# Patient Record
Sex: Female | Born: 1995 | Race: Black or African American | Hispanic: No | Marital: Married | Smoking: Never smoker
Health system: Southern US, Community
[De-identification: ages and names within clinical notes are randomized; demographics above are authoritative.]

## PROBLEM LIST (undated history)

## (undated) DIAGNOSIS — D573 Sickle-cell trait: Secondary | ICD-10-CM

## (undated) DIAGNOSIS — N390 Urinary tract infection, site not specified: Secondary | ICD-10-CM

## (undated) DIAGNOSIS — R519 Headache, unspecified: Secondary | ICD-10-CM

## (undated) DIAGNOSIS — J45909 Unspecified asthma, uncomplicated: Secondary | ICD-10-CM

## (undated) HISTORY — DX: Unspecified asthma, uncomplicated: J45.909

## (undated) HISTORY — PX: WISDOM TOOTH EXTRACTION: SHX21

---

## 2020-07-13 ENCOUNTER — Emergency Department
Admission: EM | Admit: 2020-07-13 | Discharge: 2020-07-13 | Disposition: A | Discharge status: Home / Self Care | Payer: No Typology Code available for payment source | Attending: Emergency Medicine | Admitting: Emergency Medicine

## 2020-07-13 ENCOUNTER — Other Ambulatory Visit: Payer: Self-pay

## 2020-07-13 DIAGNOSIS — T25222A Burn of second degree of left foot, initial encounter: Secondary | ICD-10-CM | POA: Insufficient documentation

## 2020-07-13 DIAGNOSIS — X100XXA Contact with hot drinks, initial encounter: Secondary | ICD-10-CM | POA: Diagnosis not present

## 2020-07-13 DIAGNOSIS — T2122XA Burn of second degree of abdominal wall, initial encounter: Secondary | ICD-10-CM | POA: Diagnosis not present

## 2020-07-13 DIAGNOSIS — Z23 Encounter for immunization: Secondary | ICD-10-CM | POA: Insufficient documentation

## 2020-07-13 DIAGNOSIS — S99922A Unspecified injury of left foot, initial encounter: Secondary | ICD-10-CM | POA: Diagnosis present

## 2020-07-13 MED ORDER — IBUPROFEN 600 MG PO TABS
600.0000 mg | ORAL_TABLET | Freq: Once | ORAL | Status: AC
  Administered 2020-07-13: 600 mg via ORAL
  Filled 2020-07-13: qty 1

## 2020-07-13 MED ORDER — IBUPROFEN 600 MG PO TABS: 600.0000 mg | ORAL_TABLET | Freq: Four times a day (QID) | ORAL | 0 refills | Status: DC | PRN

## 2020-07-13 MED ORDER — SILVER SULFADIAZINE 1 % EX CREA: TOPICAL_CREAM | CUTANEOUS | 1 refills | Status: DC

## 2020-07-13 MED ORDER — TRAMADOL HCL 50 MG PO TABS
50.0000 mg | ORAL_TABLET | Freq: Once | ORAL | Status: AC
  Administered 2020-07-13: 50 mg via ORAL
  Filled 2020-07-13: qty 1

## 2020-07-13 MED ORDER — SILVER SULFADIAZINE 1 % EX CREA: TOPICAL_CREAM | Freq: Once | CUTANEOUS | Status: AC

## 2020-07-13 MED ORDER — TETANUS-DIPHTH-ACELL PERTUSSIS 5-2.5-18.5 LF-MCG/0.5 IM SUSY
0.5000 mL | PREFILLED_SYRINGE | Freq: Once | INTRAMUSCULAR | Status: AC
  Administered 2020-07-13: 0.5 mL via INTRAMUSCULAR
  Filled 2020-07-13: qty 0.5

## 2020-07-13 MED ORDER — TRAMADOL HCL 50 MG PO TABS
50.0000 mg | ORAL_TABLET | Freq: Four times a day (QID) | ORAL | 0 refills | Status: DC | PRN
Start: 2020-07-13 — End: 2021-01-11

## 2020-07-13 NOTE — ED Triage Notes (Signed)
Pt from home, spilled hot coffee on top of left foot around 1145 am. Pt presents with large fluid filled blisters on top of left foot, 1cmx 2 cm and 2cm x 6 cm. Pt denies other burns.

## 2020-07-13 NOTE — ED Provider Notes (Signed)
[facility] Emergency Department Provider Note   ____________________________________________   Event Date/Time   First MD Initiated Contact with Patient 07/13/20 1242     (approximate)  I have reviewed the triage vital signs and the nursing notes.   HISTORY  Chief Complaint Foot Burn    HPI Kelly Hooper is a 24 y.o. female patient presents with burn to the dorsal aspect of the left foot.  Patient states she spilled hot coffee on the top of her foot.  Patient presents with blisters on the top part of her foot.  Patient denies loss sensation or loss of function.  Patient rates pain as a 6/10.  Patient described pain as "sore".  No palliative measure prior to arrival.  Patient tetanus shot is not up-to-date.         History reviewed. No pertinent past medical history.  There are no problems to display for this patient.   History reviewed. No pertinent surgical history.  Prior to Admission medications   Medication Sig Start Date End Date Taking? Authorizing Provider  ibuprofen (ADVIL) 600 MG tablet Take 1 tablet (600 mg total) by mouth every 6 (six) hours as needed. 07/13/20   Joni Reining, PA-C  silver sulfADIAZINE (SILVADENE) 1 % cream Apply to affected area twice a day dressing change. 07/13/20 07/13/21  Joni Reining, PA-C  traMADol (ULTRAM) 50 MG tablet Take 1 tablet (50 mg total) by mouth every 6 (six) hours as needed for moderate pain. 07/13/20   Joni Reining, PA-C    Allergies Patient has no allergy information on record.  History reviewed. No pertinent family history.  Social History Social History   Tobacco Use  . Smoking status: Never Smoker  . Smokeless tobacco: Never Used  Vaping Use  . Vaping Use: Never used    Review of Systems Constitutional: No fever/chills Eyes: No visual changes. ENT: No sore throat. Cardiovascular: Denies chest pain. Respiratory: Denies shortness of breath. Gastrointestinal: No abdominal pain.   No nausea, no vomiting.  No diarrhea.  No constipation. Genitourinary: Negative for dysuria. Musculoskeletal: Negative for back pain. Skin: Negative for rash.  2 bullous lesions dorsal aspect of left foot Neurological: Negative for headaches, focal weakness or numbness.   ____________________________________________   PHYSICAL EXAM:  VITAL SIGNS: ED Triage Vitals  Enc Vitals Group     BP 07/13/20 1235 124/76     Pulse Rate 07/13/20 1235 75     Resp 07/13/20 1235 16     Temp 07/13/20 1235 98 F (36.7 C)     Temp Source 07/13/20 1235 Oral     SpO2 07/13/20 1235 100 %     Weight 07/13/20 1237 165 lb (74.8 kg)     Height 07/13/20 1237 5\' 2"  (1.575 m)     Head Circumference --      Peak Flow --      Pain Score 07/13/20 1237 6     Pain Loc --      Pain Edu? --      Excl. in GC? --    Constitutional: Alert and oriented. Well appearing and in no acute distress. Cardiovascular: Normal rate, regular rhythm. Grossly normal heart sounds.  Good peripheral circulation. Respiratory: Normal respiratory effort.  No retractions. Lungs CTAB. Skin:  Skin is warm, dry and intact.  2 intact bullous lesions dorsal aspect the right foot.  Psychiatric: Mood and affect are normal. Speech and behavior are normal.  ____________________________________________   LABS (all labs ordered  are listed, but only abnormal results are displayed)  Labs Reviewed - No data to display ____________________________________________  EKG   ____________________________________________  RADIOLOGY I, Joni Reining, personally viewed and evaluated these images (plain radiographs) as part of my medical decision making, as well as reviewing the written report by the radiologist.  ED MD interpretation:    Official radiology report(s): No results found.  ____________________________________________   PROCEDURES  Procedure(s) performed (including Critical  Care):  Procedures   ____________________________________________   INITIAL IMPRESSION / ASSESSMENT AND PLAN / ED COURSE  As part of my medical decision making, I reviewed the following data within the electronic MEDICAL RECORD NUMBER         Patient presents with second-degree burns to the dorsal aspect the left foot.  Patient given discharge care instruction advised take medication as directed.  Patient advised to follow-up with the [facility] clinic in 4 days.  Return to ED if condition worsens.      ____________________________________________   FINAL CLINICAL IMPRESSION(S) / ED DIAGNOSES  Final diagnoses:  Partial thickness burn of abdomen, initial encounter     ED Discharge Orders         Ordered    silver sulfADIAZINE (SILVADENE) 1 % cream        07/13/20 1253    traMADol (ULTRAM) 50 MG tablet  Every 6 hours PRN        07/13/20 1253    ibuprofen (ADVIL) 600 MG tablet  Every 6 hours PRN        07/13/20 1253          *Please note:  Kelly Hooper was evaluated in Emergency Department on 07/13/2020 for the symptoms described in the history of present illness. She was evaluated in the context of the global COVID-19 pandemic, which necessitated consideration that the patient might be at risk for infection with the SARS-CoV-2 virus that causes COVID-19. Institutional protocols and algorithms that pertain to the evaluation of patients at risk for COVID-19 are in a state of rapid change based on information released by regulatory bodies including the CDC and federal and state organizations. These policies and algorithms were followed during the patient's care in the ED.  Some ED evaluations and interventions may be delayed as a result of limited staffing during and the pandemic.*   Note:  This document was prepared using Dragon voice recognition software and may include unintentional dictation errors.    Joni Reining, PA-C 07/13/20 1302    Dionne Bucy,  MD 07/13/20 [phone]

## 2020-07-13 NOTE — Discharge Instructions (Signed)
Follow discharge care instruction take medication as directed. °

## 2020-08-11 ENCOUNTER — Ambulatory Visit: Payer: BC Managed Care – PPO | Admitting: Nurse Practitioner

## 2020-08-11 ENCOUNTER — Encounter: Payer: Self-pay | Admitting: Nurse Practitioner

## 2020-08-11 ENCOUNTER — Other Ambulatory Visit: Payer: Self-pay

## 2020-08-11 VITALS — BP 112/62 | HR 76 | Temp 97.8°F | Ht <= 58 in | Wt 157.8 lb

## 2020-08-11 DIAGNOSIS — T25222D Burn of second degree of left foot, subsequent encounter: Secondary | ICD-10-CM | POA: Diagnosis not present

## 2020-08-11 DIAGNOSIS — Z2821 Immunization not carried out because of patient refusal: Secondary | ICD-10-CM

## 2020-08-11 DIAGNOSIS — Z6833 Body mass index (BMI) 33.0-33.9, adult: Secondary | ICD-10-CM

## 2020-08-11 DIAGNOSIS — E6609 Other obesity due to excess calories: Secondary | ICD-10-CM

## 2020-08-11 DIAGNOSIS — Z7689 Persons encountering health services in other specified circumstances: Secondary | ICD-10-CM

## 2020-08-11 DIAGNOSIS — D573 Sickle-cell trait: Secondary | ICD-10-CM | POA: Insufficient documentation

## 2020-08-11 MED ORDER — SILVER SULFADIAZINE 1 % EX CREA: TOPICAL_CREAM | CUTANEOUS | 1 refills | Status: DC

## 2020-08-11 NOTE — Progress Notes (Signed)
I,Tianna Badgett,acting as a Neurosurgeon for [employer], NP.,have documented all relevant documentation on the behalf of [employer], NP,as directed by  Charlesetta Ivory, NP while in the presence of Charlesetta Ivory, NP.  This visit occurred during the SARS-CoV-2 public health emergency.  Safety protocols were in place, including screening questions prior to the visit, additional usage of staff PPE, and extensive cleaning of exam room while observing appropriate contact time as indicated for disinfecting solutions.  Subjective:     Patient ID: Kelly Hooper , female    DOB: [birth year] , 25 y.o.   MRN: [id number]   Chief Complaint  Patient presents with  . Establish Care         HPI  Patient is here to establish care. 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 She just moved from [place] with her boyfriend. She recent had a burn at work from a coffee spill that she would liked looked at. She has just been putting cream on it that was given at the emergency room. She has a history of asthma, sickle cell trait. She has aches and pain around her menstrual period. LMP: 07/29/2021. Sexually active. No smoking. Yes to alcohol with wine occasionally.  No OBGYN but will establish care with someone. Paps smear at age 48. Patient will schedule an appt for a physical     Past Medical History:  Diagnosis Date  . Asthma      Family History  Problem Relation Age of Onset  . Sickle cell anemia Mother   . Asthma Father   . Hypertension Maternal Grandmother   . Stroke Maternal Grandmother   . Hypertension Maternal Grandfather   . Heart disease Maternal Grandfather   . Diabetes Paternal Grandfather      Current Outpatient Medications:  .  albuterol (VENTOLIN HFA) 108 (90 Base) MCG/ACT inhaler, Inhale into the lungs every 6 (six) hours as needed for wheezing or shortness of breath., Disp: , Rfl:  .  ibuprofen (ADVIL) 600 MG tablet, Take 1 tablet (600 mg total) by mouth every 6 (six) hours as  needed., Disp: 30 tablet, Rfl: 0 .  silver sulfADIAZINE (SILVADENE) 1 % cream, Apply to affected area twice a day dressing change., Disp: 50 g, Rfl: 1 .  traMADol (ULTRAM) 50 MG tablet, Take 1 tablet (50 mg total) by mouth every 6 (six) hours as needed for moderate pain., Disp: 12 tablet, Rfl: 0   No Known Allergies   Review of Systems  Constitutional: Negative.   Respiratory: Negative.  Negative for cough, shortness of breath and wheezing.   Cardiovascular: Negative.  Negative for chest pain and palpitations.  Gastrointestinal: Negative.   Skin: Positive for wound.       Burn on foot  Neurological: Negative.      Today's Vitals   08/11/20 0914  BP: 112/62  Pulse: 76  Temp: 97.8 F (36.6 C)  TempSrc: Oral  Weight: 157 lb 12.8 oz (71.6 kg)  Height: 4' 9.2" (1.453 m)   Body mass index is 33.91 kg/m.  Wt Readings from Last 3 Encounters:  08/11/20 157 lb 12.8 oz (71.6 kg)  07/13/20 165 lb (74.8 kg)    Objective:  Physical Exam Constitutional:      Appearance: Normal appearance. She is obese.  Cardiovascular:     Rate and Rhythm: Normal rate and regular rhythm.     Pulses: Normal pulses.     Heart sounds: Normal heart sounds.  Pulmonary:     Effort: Pulmonary effort  is normal.     Breath sounds: Normal breath sounds. No wheezing, rhonchi or rales.  Skin:    General: Skin is warm and dry.     Capillary Refill: Capillary refill takes less than 2 seconds.     Findings: Burn present.          Comments: Partial thickness burn to left foot- healing   Neurological:     Mental Status: She is alert.         Assessment And Plan:     1. Encounter to establish care -here to establish care.  -will schedule appointment for physical exam  -reviewed social, medical, family history with patient   2. Partial thickness burn of left foot, subsequent encounter -recently went to the ED on 07/13/20 for a burn from a coffee spill on her top left foot.  -Continue using the  silvadene 1% cream to her foot twice daily  With dressing change- refilled on today's visit.  -Patient will call us if it is not healing  -Recheck in 2 weeks   3. Influenza vaccination declined -patient declined   4. COVID-19 vaccination declined -patient declined   5. Class 1 obesity due to excess calories without serious comorbidity with body mass index (BMI) of 33.0 to 33.9 in adult   Staying healthy and adopting a healthy lifestyle for your overall health is important. You should eat 7 or more servings of fruits and vegetables per day. You should drink plenty of water to keep yourself hydrated and your kidneys healthy. This includes about 65-80+ fluid ounces of water. Limit your intake of animal fats especially for elevated cholesterol. Avoid highly processed food and limit your salt intake if you have hypertension. Avoid foods high in saturated/Trans fats. Along with a healthy diet it is also very important to maintain time for yourself to maintain a healthy mental health with low stress levels. You should get atleast 150 min of moderate intensity exercise weekly for a healthy heart. Along with eating right and exercising, aim for at least 7-9 hours of sleep daily.  Eat more whole grains which includes barley, wheat berries, oats, brown rice and whole wheat pasta. Use healthy plant oils which include olive, soy, corn, sunflower and peanut. Limit your caffeine and sugary drinks. Limit your intake of fast foods. Limit milk and dairy products to one or two daily servings.   A work not was written for this patient due to her burn on her foot for the next 2 weeks. Will recheck in 2 weeks. Her work requires her to stand on her feet a lot and walk as she works as a Firefighter.   Patient was given opportunity to ask questions. Patient verbalized understanding of the plan and was able to repeat key elements of the plan. All questions were answered to their satisfaction.  Charlesetta Ivory, NP   I,  Charlesetta Ivory, NP, have reviewed all documentation for this visit. The documentation on 08/11/20 for the exam, diagnosis, procedures, and orders are all accurate and complete.  THE PATIENT IS ENCOURAGED TO PRACTICE SOCIAL DISTANCING DUE TO THE COVID-19 PANDEMIC.

## 2020-08-11 NOTE — Patient Instructions (Signed)
Burn Care, Adult A burn is an injury to the skin or the tissues under the skin. There are three types of burns:  First degree. These burns may cause the skin to be red and a bit swollen.  Second degree. These burns are very painful and cause the skin to be very red. The skin may also leak fluid, look shiny, and start to have blisters.  Third degree. These burns cause permanent damage. They turn the skin white or black and make it look charred, dry, and leathery. Taking care of your burn properly can help to prevent pain and infection. It can also help the burn to heal more quickly. How is this treated? Right after a burn:  Rinse or soak the burn under cool water. Do this for several minutes. Do not put ice on your burn. That can cause more damage.  Lightly cover the burn with a clean (sterile) cloth (dressing). Burn care  Raise (elevate) the injured area above the level of your heart while sitting or lying down.  Follow instructions from your doctor about: ? How to clean and take care of the burn. ? When to change and remove the cloth.  Check your burn every day for signs of infection. Check for: ? More redness, swelling, or pain. ? Warmth. ? Pus or a bad smell. Medicine   Take over-the-counter and prescription medicines only as told by your doctor.  If you were prescribed antibiotic medicine, take or apply it as told by your doctor. Do not stop using the antibiotic even if your condition improves. General instructions  To prevent infection: ? Do not put butter, oil, or other home treatments on the burn. ? Do not scratch or pick at the burn. ? Do not break any blisters. ? Do not peel skin.  Do not rub your burn, even when you are cleaning it.  Protect your burn from the sun. Contact a doctor if:  Your condition does not get better.  Your condition gets worse.  You have a fever.  Your burn looks different or starts to have black or red spots on it.  Your burn  feels warm to the touch.  Your pain is not controlled with medicine. Get help right away if:  You have redness, swelling, or pain at the site of the burn.  You have fluid, blood, or pus coming from your burn.  You have red streaks near the burn.  You have very bad pain. This information is not intended to replace advice given to you by your health care provider. Make sure you discuss any questions you have with your health care provider. Document Revised: 11/11/2018 Document Reviewed: 01/09/2016 Elsevier Patient Education  2020 Elsevier Inc.  

## 2020-08-25 ENCOUNTER — Telehealth (INDEPENDENT_AMBULATORY_CARE_PROVIDER_SITE_OTHER): Payer: BC Managed Care – PPO | Admitting: Nurse Practitioner

## 2020-08-25 ENCOUNTER — Ambulatory Visit: Payer: BC Managed Care – PPO | Admitting: Nurse Practitioner

## 2020-08-25 ENCOUNTER — Encounter: Payer: Self-pay | Admitting: Nurse Practitioner

## 2020-08-25 VITALS — Temp 98.1°F

## 2020-08-25 DIAGNOSIS — X100XXD Contact with hot drinks, subsequent encounter: Secondary | ICD-10-CM | POA: Diagnosis not present

## 2020-08-25 DIAGNOSIS — T25222D Burn of second degree of left foot, subsequent encounter: Secondary | ICD-10-CM | POA: Diagnosis not present

## 2020-08-25 DIAGNOSIS — Y92214 College as the place of occurrence of the external cause: Secondary | ICD-10-CM

## 2020-08-25 NOTE — Patient Instructions (Signed)
Burn Care, Adult A burn is an injury to the skin or the tissues under the skin. There are three types of burns:  First degree. These burns may cause the skin to be red and a bit swollen.  Second degree. These burns are very painful and cause the skin to be very red. The skin may also swell, leak fluid, look shiny, and start to have blisters.  Third degree. These burns cause lasting damage. They turn the skin white or black and make it look charred, dry, and leathery. Treatment for your burn will depend on the type of burn you have. Taking good care of your burn can help to prevent pain and infection. It can also help the burn heal quickly. How to care for a first-degree burn Right after a burn:  Rinse or soak the burn under cool water for 5 minutes or more. Do not put ice on your burn. That can cause more damage.  Put a cool, clean, wet cloth on your burn.  Put lotion or gel with aloe vera on your burn. Caring for the burn Clean and care for your burn. Your doctor may tell you:  To clean the burn using soap and water.  To pat the burn dry using a clean cloth. Do not rub or scrub the burn.  To put lotion or gel with aloe vera on your burn. How to care for a second-degree burn Right after a burn:  Rinse or soak the burn under cool water. Do this for 5 to 10 minutes. Do not put ice on your burn. This can cause more damage.  Remove any jewelry near the burned area.  Cover the burn with a clean cloth. Caring for the burn  Raise (elevate) the burned area above the level of your heart while sitting or lying down.  Clean and care for your burn. Your doctor may tell you: ? To clean or rinse your burn. ? To put a cream or ointment on the burn. ? To place a germ-free (sterile) dressing over the burn. A dressing is a material that is placed on a burn to help it heal. How to care for a third-degree burn Right after a burn:  Cover the burn with a clean, dry cloth.  Seek treatment  right away if you have this kind of burn. You may: ? Need to stay in the hospital. ? Have surgery to remove burned tissue. ? Have surgery to put new skin on the burned area. ? Be given fluids through an IV tube. Caring for the burn Clean and care for your burn. Your doctor may tell you:  To clean or rinse your burn.  To put a cream or ointment on the burn.  To put a sterile dressing in the burn. This is called packing.  To place a sterile dressing over the burn. Other things to do  Raise the burned area above the level of your heart while sitting or lying down.  Wear splints or immobilizers if told by your doctor.  Rest as told by your doctor. Do not do sports or other activities until your doctor approves. How to prevent infection when caring for a burn  Take these steps to prevent infection: ? Wash your hands with soap and water for at least 20 seconds before and after caring for your burn. If you cannot use soap and water, use hand sanitizer. ? Wear clean or sterile gloves as told by your doctor. ? Do not put butter, oil,   toothpaste, or other home remedies on the burn. ? Do not scratch or pick at the burn. ? Do not break any blisters. ? Do not peel the skin. ? Do not rub your burn, even when you are cleaning it.  Check your burn every day for these signs of infection: ? More redness, swelling, or pain. ? Warmth. ? Pus or a bad smell. ? Red streaks around the burn.   Follow these instructions at home Medicines  Take over-the-counter and prescription medicines only as told by your doctor.  If you were prescribed an antibiotic medicine, use it as told by your doctor. Do not stop using the antibiotic even if your condition gets better.  Your doctor may ask you to take medicine for pain before you change your dressing. General instructions  Protect your burn from the sun.  Drink enough fluid to keep your pee (urine) pale yellow.  Do not use any products that contain  nicotine or tobacco, such as cigarettes, e-cigarettes, and chewing tobacco. These can delay healing. If you need help quitting, ask your doctor.  Keep all follow-up visits as told by your doctor. This is important.   Contact a doctor if:  Your condition does not get better.  Your condition gets worse.  You have a fever or chills.  Your burn feels warm to the touch.  You have more redness, swelling, or pain on your burn.  Your burn looks different or starts to have black or red spots on it.  Your pain does not get better with medicine. Get help right away if:  You have more fluid, blood, or pus coming from your burn.  You have red streaks near the burn.  You have very bad pain. Summary  There are three types of burns. They are first degree, second degree, and third degree. Of these, a third-degree burn is most serious. This must be treated right away.  Treatment for your burn will depend on the type of burn you have.  Do not put butter, oil, toothpaste, or other home remedies on the burn. These things can damage your skin.  Follow instructions from your doctor about how to clean and take care of your burn. This information is not intended to replace advice given to you by your health care provider. Make sure you discuss any questions you have with your health care provider. Document Revised: 09/10/2019 Document Reviewed: 05/11/2019 Elsevier Patient Education  2021 [employer] Inc.  

## 2020-08-25 NOTE — Progress Notes (Signed)
Virtual Visit via Video call    This visit type was conducted due to national recommendations for restrictions regarding the COVID-19 Pandemic (e.g. social distancing) in an effort to limit this patient's exposure and mitigate transmission in our community.  Due to her co-morbid illnesses, this patient is at least at moderate risk for complications without adequate follow up.  This format is felt to be most appropriate for this patient at this time.  All issues noted in this document were discussed and addressed.  A limited physical exam was performed with this format.    This visit type was conducted due to national recommendations for restrictions regarding the COVID-19 Pandemic (e.g. social distancing) in an effort to limit this patient's exposure and mitigate transmission in our community.  Patients identity confirmed using two different identifiers.  This format is felt to be most appropriate for this patient at this time.  All issues noted in this document were discussed and addressed.  No physical exam was performed (except for noted visual exam findings with Video Visits).    Date:  08/25/2020   ID:  Kelly Hooper, DOB 1995-12-26, MRN [id number]  Patient Location: Home   Provider location:  Home     Chief Complaint:  Recheck on her left foot for her 2nd degree burn   History of Present Illness:    Kelly Hooper is a 25 y.o. female who presents via video conferencing for a telehealth visit today.    The patient does not have symptoms concerning for COVID-19 infection   Patient is here today on video virtual call for a check of her foot for which she received a 2nd degree burn from. She is doing well. She did see a specialist yesterday and she is to return to work tom. They prescribed her mobic to take once daily for pain however she has not picked it up yet. She is still using the silvadene dressing a twice daily as needed. She is taking ibuprofen for comfort. She will be going back to  work. Mainly siting down. She has no other concerns today.    Past Medical History:  Diagnosis Date  . Asthma    Past Surgical History:  Procedure Laterality Date  . WISDOM TOOTH EXTRACTION       No outpatient medications have been marked as taking for the 08/25/20 encounter (Video Visit) with Charlesetta Ivory, NP.     Allergies:   Patient has no known allergies.   Social History   Tobacco Use  . Smoking status: Never Smoker  . Smokeless tobacco: Never Used  Vaping Use  . Vaping Use: Never used  Substance Use Topics  . Alcohol use: Yes    Alcohol/week: 1.0 standard drink    Types: 1 Glasses of wine per week  . Drug use: Never     Family Hx: The patient's family history includes Asthma in her father; Diabetes in her paternal grandfather; Heart disease in her maternal grandfather; Hypertension in her maternal grandfather and maternal grandmother; Sickle cell anemia in her mother; Stroke in her maternal grandmother.  ROS:   Please see the history of present illness.    Review of Systems  Respiratory: Negative for cough and shortness of breath.   Skin:       2nd degree burn to her foot.     All other systems reviewed and are negative.   Labs/Other Tests and Data Reviewed:    Recent Labs: No results found for requested labs within last 8760  hours.   Recent Lipid Panel No results found for: CHOL, TRIG, HDL, CHOLHDL, LDLCALC, LDLDIRECT  Wt Readings from Last 3 Encounters:  08/11/20 157 lb 12.8 oz (71.6 kg)  07/13/20 165 lb (74.8 kg)     Exam:    Vital Signs:  Temp 98.1 F (36.7 C) (Oral)   LMP 07/29/2020     Physical Exam Vitals and nursing note reviewed.  HENT:     Head: Normocephalic and atraumatic.  Pulmonary:     Effort: Pulmonary effort is normal.  Musculoskeletal:       Feet:  Neurological:     Mental Status: She is alert and oriented to person, place, and time.  Psychiatric:        Mood and Affect: Affect normal.     ASSESSMENT & PLAN:     1) Partial thickness to the left foot, subsequent encounter  -Patient will continue to use silvadene dressing twice daily as needed  -She may use OTC ibuprofen as needed for comfort or pain -Limited activity  -Wear comfortable footwear -May return but only for sitting down duties.   Follow up:  As needed. Call or return if any problems or concerns.   Patient Risk:   After full review of this patients clinical status, I feel that they are at least moderate risk at this time.  Time:   Today, I have spent 10 minutes with the patient with telehealth technology discussing above diagnoses.     Medication Adjustments/Labs and Tests Ordered: Current medicines are reviewed at length with the patient today.  Concerns regarding medicines are outlined above.   Tests Ordered: No orders of the defined types were placed in this encounter.   Medication Changes: No orders of the defined types were placed in this encounter.   Disposition:  Follow up as needed   Signed, Charlesetta Ivory, NP

## 2020-11-23 ENCOUNTER — Ambulatory Visit (INDEPENDENT_AMBULATORY_CARE_PROVIDER_SITE_OTHER): Payer: BC Managed Care – PPO | Admitting: Nurse Practitioner

## 2020-11-23 ENCOUNTER — Other Ambulatory Visit: Payer: Self-pay

## 2020-11-23 VITALS — BP 126/70 | HR 79 | Temp 98.1°F | Ht 61.8 in | Wt 160.8 lb

## 2020-11-23 DIAGNOSIS — T25222D Burn of second degree of left foot, subsequent encounter: Secondary | ICD-10-CM | POA: Diagnosis not present

## 2020-11-23 DIAGNOSIS — Z01419 Encounter for gynecological examination (general) (routine) without abnormal findings: Secondary | ICD-10-CM

## 2020-11-23 DIAGNOSIS — Z0000 Encounter for general adult medical examination without abnormal findings: Secondary | ICD-10-CM

## 2020-11-23 NOTE — Patient Instructions (Signed)
Health Maintenance, Female Adopting a healthy lifestyle and getting preventive care are important in promoting health and wellness. Ask your health care provider about:  The right schedule for you to have regular tests and exams.  Things you can do on your own to prevent diseases and keep yourself healthy. What should I know about diet, weight, and exercise? Eat a healthy diet  Eat a diet that includes plenty of vegetables, fruits, low-fat dairy products, and lean protein.  Do not eat a lot of foods that are high in solid fats, added sugars, or sodium.   Maintain a healthy weight Body mass index (BMI) is used to identify weight problems. It estimates body fat based on height and weight. Your health care provider can help determine your BMI and help you achieve or maintain a healthy weight. Get regular exercise Get regular exercise. This is one of the most important things you can do for your health. Most adults should:  Exercise for at least 150 minutes each week. The exercise should increase your heart rate and make you sweat (moderate-intensity exercise).  Do strengthening exercises at least twice a week. This is in addition to the moderate-intensity exercise.  Spend less time sitting. Even light physical activity can be beneficial. Watch cholesterol and blood lipids Have your blood tested for lipids and cholesterol at 25 years of age, then have this test every 5 years. Have your cholesterol levels checked more often if:  Your lipid or cholesterol levels are high.  You are older than 25 years of age.  You are at high risk for heart disease. What should I know about cancer screening? Depending on your health history and family history, you may need to have cancer screening at various ages. This may include screening for:  Breast cancer.  Cervical cancer.  Colorectal cancer.  Skin cancer.  Lung cancer. What should I know about heart disease, diabetes, and high blood  pressure? Blood pressure and heart disease  High blood pressure causes heart disease and increases the risk of stroke. This is more likely to develop in people who have high blood pressure readings, are of African descent, or are overweight.  Have your blood pressure checked: ? Every 3-5 years if you are 18-39 years of age. ? Every year if you are 40 years old or older. Diabetes Have regular diabetes screenings. This checks your fasting blood sugar level. Have the screening done:  Once every three years after age 40 if you are at a normal weight and have a low risk for diabetes.  More often and at a younger age if you are overweight or have a high risk for diabetes. What should I know about preventing infection? Hepatitis B If you have a higher risk for hepatitis B, you should be screened for this virus. Talk with your health care provider to find out if you are at risk for hepatitis B infection. Hepatitis C Testing is recommended for:  Everyone born from 1945 through 1965.  Anyone with known risk factors for hepatitis C. Sexually transmitted infections (STIs)  Get screened for STIs, including gonorrhea and chlamydia, if: ? You are sexually active and are younger than 24 years of age. ? You are older than 24 years of age and your health care provider tells you that you are at risk for this type of infection. ? Your sexual activity has changed since you were last screened, and you are at increased risk for chlamydia or gonorrhea. Ask your health care provider   if you are at risk.  Ask your health care provider about whether you are at high risk for HIV. Your health care provider may recommend a prescription medicine to help prevent HIV infection. If you choose to take medicine to prevent HIV, you should first get tested for HIV. You should then be tested every 3 months for as long as you are taking the medicine. Pregnancy  If you are about to stop having your period (premenopausal) and  you may become pregnant, seek counseling before you get pregnant.  Take 400 to 800 micrograms (mcg) of folic acid every day if you become pregnant.  Ask for birth control (contraception) if you want to prevent pregnancy. Osteoporosis and menopause Osteoporosis is a disease in which the bones lose minerals and strength with aging. This can result in bone fractures. If you are 65 years old or older, or if you are at risk for osteoporosis and fractures, ask your health care provider if you should:  Be screened for bone loss.  Take a calcium or vitamin D supplement to lower your risk of fractures.  Be given hormone replacement therapy (HRT) to treat symptoms of menopause. Follow these instructions at home: Lifestyle  Do not use any products that contain nicotine or tobacco, such as cigarettes, e-cigarettes, and chewing tobacco. If you need help quitting, ask your health care provider.  Do not use street drugs.  Do not share needles.  Ask your health care provider for help if you need support or information about quitting drugs. Alcohol use  Do not drink alcohol if: ? Your health care provider tells you not to drink. ? You are pregnant, may be pregnant, or are planning to become pregnant.  If you drink alcohol: ? Limit how much you use to 0-1 drink a day. ? Limit intake if you are breastfeeding.  Be aware of how much alcohol is in your drink. In the [place] one drink equals one 12 oz bottle of beer (355 mL), one 5 oz glass of wine (148 mL), or one 1 oz glass of hard liquor (44 mL). General instructions  Schedule regular health, dental, and eye exams.  Stay current with your vaccines.  Tell your health care provider if: ? You often feel depressed. ? You have ever been abused or do not feel safe at home. Summary  Adopting a healthy lifestyle and getting preventive care are important in promoting health and wellness.  Follow your health care provider's instructions about healthy  diet, exercising, and getting tested or screened for diseases.  Follow your health care provider's instructions on monitoring your cholesterol and blood pressure. This information is not intended to replace advice given to you by your health care provider. Make sure you discuss any questions you have with your health care provider. Document Revised: 07/15/2018 Document Reviewed: 07/15/2018 Elsevier Patient Education  2021 [employer] Inc[employer]  

## 2020-11-23 NOTE — Progress Notes (Signed)
I,Tianna Badgett,acting as a Education administrator for [employer], NP.,have documented all relevant documentation on the behalf of [employer], NP,as directed by  Bary Castilla, NP while in the presence of Bary Castilla, NP.  This visit occurred during the SARS-CoV-2 public health emergency.  Safety protocols were in place, including screening questions prior to the visit, additional usage of staff PPE, and extensive cleaning of exam room while observing appropriate contact time as indicated for disinfecting solutions.  Subjective:     Patient ID: Kelly Hooper , female    DOB: September 29, 1995 , 25 y.o.   MRN: [id number]   Chief Complaint  Patient presents with  . Annual Exam    HPI  Patient is here for physical exam. She has no concerns at this time. She would like LMP: 2 weeks; mild cramping  Sexual active: condom  Drink: occasional  Smoke: occasional vap every  Diet: she eats a lot of pasta. She eats a some fruit and vegetables  Exercise: None  She had COVID in 2020 March  Wt Readings from Last 3 Encounters: 11/23/20 : 160 lb 12.8 oz (72.9 kg) 08/11/20 : 157 lb 12.8 oz (71.6 kg) 07/13/20 : 165 lb (74.8 kg)     Past Medical History:  Diagnosis Date  . Asthma      Family History  Problem Relation Age of Onset  . Sickle cell anemia Mother   . Asthma Father   . Hypertension Maternal Grandmother   . Stroke Maternal Grandmother   . Hypertension Maternal Grandfather   . Heart disease Maternal Grandfather   . Diabetes Paternal Grandfather      Current Outpatient Medications:  .  albuterol (VENTOLIN HFA) 108 (90 Base) MCG/ACT inhaler, Inhale into the lungs every 6 (six) hours as needed for wheezing or shortness of breath., Disp: , Rfl:  .  ibuprofen (ADVIL) 600 MG tablet, Take 1 tablet (600 mg total) by mouth every 6 (six) hours as needed., Disp: 30 tablet, Rfl: 0 .  silver sulfADIAZINE (SILVADENE) 1 % cream, Apply to affected area twice a day dressing change., Disp: 50  g, Rfl: 1 .  traMADol (ULTRAM) 50 MG tablet, Take 1 tablet (50 mg total) by mouth every 6 (six) hours as needed for moderate pain., Disp: 12 tablet, Rfl: 0   No Known Allergies    The patient states she uses condoms for birth control. Last LMP was Patient's last menstrual period was 11/03/2020 (approximate)..Negative for: breast discharge, breast lump(s), breast pain and breast self exam. Associated symptoms include abnormal vaginal bleeding. Pertinent negatives include abnormal bleeding (hematology), anxiety, decreased libido, depression, difficulty falling sleep, dyspareunia, history of infertility, nocturia, sexual dysfunction, sleep disturbances, urinary incontinence, urinary urgency, vaginal discharge and vaginal itching. Diet regular.The patient states her exercise level is    . The patient's tobacco use is:  Social History   Tobacco Use  Smoking Status Never Smoker  Smokeless Tobacco Never Used  . She has been exposed to passive smoke. The patient's alcohol use is:  Social History   Substance and Sexual Activity  Alcohol Use Yes  . Alcohol/week: 1.0 standard drink  . Types: 1 Glasses of wine per week  . Additional information: Last pap pt. Does not recall , next one scheduled for this year with a OBGYN   Review of Systems  Constitutional: Negative.  Negative for chills and fatigue.  HENT: Negative.  Negative for congestion, hearing loss and rhinorrhea.   Eyes: Negative.   Respiratory: Negative.  Negative for apnea, shortness  of breath and wheezing.   Cardiovascular: Negative.  Negative for chest pain and palpitations.  Gastrointestinal: Negative.  Negative for diarrhea, nausea and vomiting.  Endocrine: Negative.   Genitourinary: Negative.   Musculoskeletal: Negative.  Negative for arthralgias and myalgias.  Skin: Negative.   Allergic/Immunologic: Negative.   Neurological: Negative.  Negative for dizziness, tremors and numbness.  Hematological: Negative.    Psychiatric/Behavioral: Negative.  Negative for sleep disturbance.     Today's Vitals   11/23/20 0857  BP: 126/70  Pulse: 79  Temp: 98.1 F (36.7 C)  TempSrc: Oral  Weight: 160 lb 12.8 oz (72.9 kg)  Height: 5' 1.8" (1.57 m)   Body mass index is 29.6 kg/m.   Objective:  Physical Exam Vitals and nursing note reviewed.  Constitutional:      Appearance: Normal appearance.  HENT:     Head: Normocephalic and atraumatic.     Right Ear: Tympanic membrane, ear canal and external ear normal. There is no impacted cerumen.     Left Ear: Tympanic membrane, ear canal and external ear normal. There is no impacted cerumen.     Nose: Nose normal.     Mouth/Throat:     Mouth: Mucous membranes are moist.     Pharynx: Oropharynx is clear.  Eyes:     Extraocular Movements: Extraocular movements intact.     Conjunctiva/sclera: Conjunctivae normal.     Pupils: Pupils are equal, round, and reactive to light.  Cardiovascular:     Rate and Rhythm: Normal rate and regular rhythm.     Pulses: Normal pulses.     Heart sounds: Normal heart sounds.  Pulmonary:     Effort: Pulmonary effort is normal. No respiratory distress.     Breath sounds: Normal breath sounds. No wheezing.  Chest:  Breasts:     Tanner Score is 5.     Right: Normal.     Left: Normal.    Abdominal:     General: Abdomen is flat. Bowel sounds are normal.     Palpations: Abdomen is soft.  Genitourinary:    Comments: Refused. Patient would like to see a OBGYN  Musculoskeletal:        General: No swelling. Normal range of motion.     Cervical back: Normal range of motion and neck supple.  Skin:    General: Skin is warm and dry.     Capillary Refill: Capillary refill takes less than 2 seconds.  Neurological:     General: No focal deficit present.     Mental Status: She is alert and oriented to person, place, and time. Mental status is at baseline.  Psychiatric:        Mood and Affect: Mood normal.        Behavior:  Behavior normal.        Thought Content: Thought content normal.         Assessment And Plan:     1. Encounter for annual physical exam --Patient is here for their annual physical exam and we discussed any changes to medication and medical history.  -Behavior modification was discussed as well as diet and exercise history  -Patient will continue to exercise regularly and modify their diet.  -Recommendation for yearly physical annuals, immunization and screenings including mammogram and colonoscopy were discussed with the patient.  -Recommended intake of multivitamin, vitamin D and calcium.  -Individualized advise was given to the patient pertaining to their own health history in regards to diet, exercise, medical condition and referrals.  -  CBC - Hemoglobin A1c - CMP14+EGFR - Lipid panel - Hepatitis C antibody - HIV Antibody (routine testing w rflx)  2. Partial thickness burn of left foot, subsequent encounter -Follows up with Sarajane Jews MD  -Doing well; taking mobic as needed. No restrictions. Regular footwear. Activity as tolerated  3. Encounter for gynecological examination -Patient would like to establish care with a OBGYN  -Will send referral  - Ambulatory referral to Obstetrics / Gynecology  Staying healthy and adopting a healthy lifestyle for your overall health is important. You should eat 7 or more servings of fruits and vegetables per day. You should drink plenty of water to keep yourself hydrated and your kidneys healthy. This includes about 65-80+ fluid ounces of water. Limit your intake of animal fats especially for elevated cholesterol. Avoid highly processed food and limit your salt intake if you have hypertension. Avoid foods high in saturated/Trans fats. Along with a healthy diet it is also very important to maintain time for yourself to maintain a healthy mental health with low stress levels. You should get atleast 150 min of moderate intensity exercise weekly for a  healthy heart. Along with eating right and exercising, aim for at least 7-9 hours of sleep daily.  Eat more whole grains which includes barley, wheat berries, oats, brown rice and whole wheat pasta. Use healthy plant oils which include olive, soy, corn, sunflower and peanut. Limit your caffeine and sugary drinks. Limit your intake of fast foods. Limit milk and dairy products to one or two daily servings.     Patient was given opportunity to ask questions. Patient verbalized understanding of the plan and was able to repeat key elements of the plan. All questions were answered to their satisfaction.   Bary Castilla, DNP   I, Bary Castilla, DNP have reviewed all documentation for this visit. The documentation on 11/23/20 for the exam, diagnosis, procedures, and orders are all accurate and complete.     THE PATIENT IS ENCOURAGED TO PRACTICE SOCIAL DISTANCING DUE TO THE COVID-19 PANDEMIC.

## 2020-11-24 LAB — HEMOGLOBIN A1C
Est. average glucose Bld gHb Est-mCnc: 103 mg/dL
Hgb A1c MFr Bld: 5.2 % (ref 4.8–5.6)

## 2020-11-24 LAB — CMP14+EGFR
ALT: 15 IU/L (ref 0–32)
AST: 12 IU/L (ref 0–40)
Albumin/Globulin Ratio: 1.9 (ref 1.2–2.2)
Albumin: 4.8 g/dL (ref 3.9–5.0)
Alkaline Phosphatase: 66 IU/L (ref 44–121)
BUN/Creatinine Ratio: 13 (ref 9–23)
BUN: 9 mg/dL (ref 6–20)
Bilirubin Total: 0.3 mg/dL (ref 0.0–1.2)
CO2: 22 mmol/L (ref 20–29)
Calcium: 10 mg/dL (ref 8.7–10.2)
Chloride: 100 mmol/L (ref 96–106)
Creatinine, Ser: 0.71 mg/dL (ref 0.57–1.00)
Globulin, Total: 2.5 g/dL (ref 1.5–4.5)
Glucose: 89 mg/dL (ref 65–99)
Potassium: 4.6 mmol/L (ref 3.5–5.2)
Sodium: 138 mmol/L (ref 134–144)
Total Protein: 7.3 g/dL (ref 6.0–8.5)
eGFR: 121 mL/min/{1.73_m2} (ref >59)

## 2020-11-24 LAB — CBC
Hematocrit: 35.5 % (ref 34.0–46.6)
Hemoglobin: 12.1 g/dL (ref 11.1–15.9)
MCH: 29.2 pg (ref 26.6–33.0)
MCHC: 34.1 g/dL (ref 31.5–35.7)
MCV: 86 fL (ref 79–97)
Platelets: 342 10*3/uL (ref 150–450)
RBC: 4.14 x10E6/uL (ref 3.77–5.28)
RDW: 13.6 % (ref 11.7–15.4)
WBC: 10 10*3/uL (ref 3.4–10.8)

## 2020-11-24 LAB — HIV ANTIBODY (ROUTINE TESTING W REFLEX): HIV Screen 4th Generation wRfx: NONREACTIVE

## 2020-11-24 LAB — LIPID PANEL
Chol/HDL Ratio: 3.5 ratio (ref 0.0–4.4)
Cholesterol, Total: 121 mg/dL (ref 100–199)
HDL: 35 mg/dL — ABNORMAL LOW (ref >39)
LDL Chol Calc (NIH): 75 mg/dL (ref 0–99)
Triglycerides: 44 mg/dL (ref 0–149)
VLDL Cholesterol Cal: 11 mg/dL (ref 5–40)

## 2020-11-24 LAB — HEPATITIS C ANTIBODY: Hep C Virus Ab: <0.1 s/co ratio (ref 0.0–0.9)

## 2021-01-11 ENCOUNTER — Other Ambulatory Visit (HOSPITAL_COMMUNITY)
Admission: RE | Admit: 2021-01-11 | Discharge: 2021-01-11 | Disposition: A | Discharge status: Home / Self Care | Payer: BC Managed Care – PPO | Source: Ambulatory Visit | Attending: Obstetrics and Gynecology | Admitting: Obstetrics and Gynecology

## 2021-01-11 ENCOUNTER — Encounter: Payer: Self-pay | Admitting: Obstetrics and Gynecology

## 2021-01-11 ENCOUNTER — Ambulatory Visit (INDEPENDENT_AMBULATORY_CARE_PROVIDER_SITE_OTHER): Payer: BC Managed Care – PPO | Admitting: Obstetrics and Gynecology

## 2021-01-11 ENCOUNTER — Other Ambulatory Visit: Payer: Self-pay

## 2021-01-11 VITALS — BP 124/77 | HR 74 | Wt 161.0 lb

## 2021-01-11 DIAGNOSIS — Z803 Family history of malignant neoplasm of breast: Secondary | ICD-10-CM

## 2021-01-11 DIAGNOSIS — Z01419 Encounter for gynecological examination (general) (routine) without abnormal findings: Secondary | ICD-10-CM

## 2021-01-11 NOTE — Progress Notes (Signed)
Obstetrics and Gynecology Annual Patient Evaluation  Appointment Date: 01/11/2021  OBGYN Clinic: [facility]  Primary Care Provider: [facility], Charlesetta Ivory, NP  Chief Complaint:  Chief Complaint  Patient presents with   Gynecologic Exam    History of Present Illness: Kelly Hooper is a 25 y.o. African-American G0 seen for the above chief complaint. Her past medical history is significant for FHx of breast cancer   Review of Systems: Pertinent items noted in HPI and remainder of comprehensive ROS otherwise negative.    Patient Active Problem List   Diagnosis Date Noted   Family history of breast cancer 01/11/2021   Sickle cell trait (HCC) 08/11/2020    Past Medical History:  Past Medical History:  Diagnosis Date   Asthma     Past Surgical History:  Past Surgical History:  Procedure Laterality Date   WISDOM TOOTH EXTRACTION      Past Obstetrical History:  OB History  Gravida Para Term Preterm AB Living  0 0 0 0 0 0  SAB IAB Ectopic Multiple Live Births  0 0 0 0 0    Past Gynecological History: As per HPI. Periods: qmonth, regular, 4-7d, somewhat heavy, not painful History of Pap Smear(s): No. She is currently using no method for contraception.   Social History:  Social History   Socioeconomic History   Marital status: Significant Other    Spouse name: Not on file   Number of children: Not on file   Years of education: Not on file   Highest education level: Not on file  Occupational History   Not on file  Tobacco Use   Smoking status: Never   Smokeless tobacco: Never  Vaping Use   Vaping Use: Never used  Substance and Sexual Activity   Alcohol use: Yes    Alcohol/week: 1.0 standard drink    Types: 1 Glasses of wine per week   Drug use: Never   Sexual activity: Yes    Birth control/protection: Condom  Other Topics Concern   Not on file  Social History Narrative   Not on file    Social Determinants of Health   Financial Resource Strain: Not on file  Food Insecurity: Food Insecurity Present   Worried About Running Out of Food in the Last Year: Sometimes true   Ran Out of Food in the Last Year: Sometimes true  Transportation Needs: No Transportation Needs   Lack of Transportation (Medical): No   Lack of Transportation (Non-Medical): No  Physical Activity: Not on file  Stress: Not on file  Social Connections: Not on file  Intimate Partner Violence: Not on file    Family History:  Family History  Problem Relation Age of Onset   Sickle cell anemia Mother    Asthma Father    Hypertension Maternal Grandmother    Stroke Maternal Grandmother    Hypertension Maternal Grandfather    Heart disease Maternal Grandfather    Diabetes Paternal Grandfather    Breast cancer Paternal Aunt    Breast cancer Paternal Uncle      Medications Noha Milberger had no medications administered during this visit. Current Outpatient Medications  Medication Sig Dispense Refill   albuterol (VENTOLIN HFA) 108 (90 Base) MCG/ACT inhaler Inhale into the lungs every 6 (six) hours as needed for wheezing or shortness of breath.     ibuprofen (ADVIL) 600 MG tablet Take 1 tablet (600 mg total) by mouth every 6 (six) hours as needed. 30 tablet 0  No current facility-administered medications for this visit.    Allergies Apple, Iodine, and Shellfish allergy   Physical Exam:  BP 124/77   Pulse 74   Wt 161 lb (73 kg)   BMI 29.64 kg/m  Body mass index is 29.64 kg/m. General appearance: Well nourished, well developed female in no acute distress.  Cardiovascular: normal s1 and s2.  No murmurs, rubs or gallops. Respiratory:  Clear to auscultation bilateral. Normal respiratory effort Abdomen: positive bowel sounds and no masses, hernias; diffusely non tender to palpation, non distended Breasts: breasts appear normal, no suspicious masses, no skin or nipple changes or axillary nodes, and  normal palpation. Neuro/Psych:  Normal mood and affect.  Skin:  Warm and dry.  Lymphatic:  No inguinal lymphadenopathy.   Pelvic exam: is not limited by body habitus EGBUS: within normal limits Vagina: within normal limits and with no blood or discharge in the vault Cervix: normal appearing cervix without tenderness, discharge or lesions. Uterus:  nonenlarged and non tender Adnexa:  normal adnexa and no mass, fullness, tenderness Rectovaginal: deferred  Laboratory: none  Radiology: none  Assessment: pt doing well  Plan:  1. Well woman exam Routine care. Pt declines STD testing, contraception. Pt using NSAIDs with periods. Recommend trying to start them a day before flow is to begin - Cytology - PAP( )  2. Family history of breast cancer Paternal aunt in her 11s and uncle too. Pt unsure if had genetic testing, but I told her that they at least would've been offered it since the dx was in the past few years. I offered her genetic testing if she's ever interested in the future. Recommend qmonth self breast exams. May need to start mammos in her 30s.    RTC 1 year and PRN  Cornelia Copa MD Attending Center for [facility])

## 2021-01-11 NOTE — Patient Instructions (Signed)
Start folic acid daily (400-800mg  at least) if you decide you want to try and conceive

## 2021-01-15 LAB — CYTOLOGY - PAP
Adequacy: ABSENT
Diagnosis: NEGATIVE

## 2021-08-08 ENCOUNTER — Inpatient Hospital Stay (HOSPITAL_COMMUNITY)
Admission: AD | Admit: 2021-08-08 | Discharge: 2021-08-08 | Disposition: A | Discharge status: Home / Self Care | Payer: BC Managed Care – PPO | Attending: Obstetrics and Gynecology | Admitting: Obstetrics and Gynecology

## 2021-08-08 ENCOUNTER — Telehealth: Payer: Self-pay | Admitting: General Practice

## 2021-08-08 ENCOUNTER — Inpatient Hospital Stay (HOSPITAL_COMMUNITY): Payer: BC Managed Care – PPO

## 2021-08-08 ENCOUNTER — Encounter (HOSPITAL_COMMUNITY): Payer: Self-pay | Admitting: Obstetrics and Gynecology

## 2021-08-08 DIAGNOSIS — Z3A01 Less than 8 weeks gestation of pregnancy: Secondary | ICD-10-CM | POA: Diagnosis not present

## 2021-08-08 DIAGNOSIS — O26891 Other specified pregnancy related conditions, first trimester: Secondary | ICD-10-CM | POA: Insufficient documentation

## 2021-08-08 DIAGNOSIS — Z3490 Encounter for supervision of normal pregnancy, unspecified, unspecified trimester: Secondary | ICD-10-CM

## 2021-08-08 DIAGNOSIS — O3680X0 Pregnancy with inconclusive fetal viability, not applicable or unspecified: Secondary | ICD-10-CM

## 2021-08-08 DIAGNOSIS — O26899 Other specified pregnancy related conditions, unspecified trimester: Secondary | ICD-10-CM | POA: Diagnosis not present

## 2021-08-08 DIAGNOSIS — R109 Unspecified abdominal pain: Secondary | ICD-10-CM

## 2021-08-08 DIAGNOSIS — R102 Pelvic and perineal pain: Secondary | ICD-10-CM | POA: Diagnosis not present

## 2021-08-08 HISTORY — DX: Sickle-cell trait: D57.3

## 2021-08-08 HISTORY — DX: Headache, unspecified: R51.9

## 2021-08-08 HISTORY — DX: Urinary tract infection, site not specified: N39.0

## 2021-08-08 LAB — URINALYSIS, ROUTINE W REFLEX MICROSCOPIC
Bilirubin Urine: NEGATIVE
Glucose, UA: NEGATIVE mg/dL
Ketones, ur: NEGATIVE mg/dL
Leukocytes,Ua: NEGATIVE
Nitrite: NEGATIVE
Protein, ur: NEGATIVE mg/dL
Specific Gravity, Urine: 1.01 (ref 1.005–1.030)
pH: 7 (ref 5.0–8.0)

## 2021-08-08 LAB — TYPE AND SCREEN
ABO/RH(D): A POS
Antibody Screen: NEGATIVE

## 2021-08-08 LAB — URINALYSIS, MICROSCOPIC (REFLEX)

## 2021-08-08 LAB — CBC WITH DIFFERENTIAL/PLATELET
Abs Immature Granulocytes: 0.03 10*3/uL (ref 0.00–0.07)
Basophils Absolute: 0 10*3/uL (ref 0.0–0.1)
Basophils Relative: 0 %
Eosinophils Absolute: 0.1 10*3/uL (ref 0.0–0.5)
Eosinophils Relative: 1 %
HCT: 31.1 % — ABNORMAL LOW (ref 36.0–46.0)
Hemoglobin: 10.8 g/dL — ABNORMAL LOW (ref 12.0–15.0)
Immature Granulocytes: 0 %
Lymphocytes Relative: 21 %
Lymphs Abs: 1.8 10*3/uL (ref 0.7–4.0)
MCH: 29.3 pg (ref 26.0–34.0)
MCHC: 34.7 g/dL (ref 30.0–36.0)
MCV: 84.3 fL (ref 80.0–100.0)
Monocytes Absolute: 0.8 10*3/uL (ref 0.1–1.0)
Monocytes Relative: 9 %
Neutro Abs: 5.8 10*3/uL (ref 1.7–7.7)
Neutrophils Relative %: 69 %
Platelets: 303 10*3/uL (ref 150–400)
RBC: 3.69 MIL/uL — ABNORMAL LOW (ref 3.87–5.11)
RDW: 13.6 % (ref 11.5–15.5)
WBC: 8.5 10*3/uL (ref 4.0–10.5)
nRBC: 0 % (ref 0.0–0.2)

## 2021-08-08 LAB — COMPREHENSIVE METABOLIC PANEL
ALT: 19 U/L (ref 0–44)
AST: 22 U/L (ref 15–41)
Albumin: 3.8 g/dL (ref 3.5–5.0)
Alkaline Phosphatase: 45 U/L (ref 38–126)
Anion gap: 6 (ref 5–15)
BUN: 5 mg/dL — ABNORMAL LOW (ref 6–20)
CO2: 24 mmol/L (ref 22–32)
Calcium: 8.9 mg/dL (ref 8.9–10.3)
Chloride: 106 mmol/L (ref 98–111)
Creatinine, Ser: 0.65 mg/dL (ref 0.44–1.00)
GFR, Estimated: >60 mL/min (ref >60)
Glucose, Bld: 107 mg/dL — ABNORMAL HIGH (ref 70–99)
Potassium: 3.9 mmol/L (ref 3.5–5.1)
Sodium: 136 mmol/L (ref 135–145)
Total Bilirubin: 0.6 mg/dL (ref 0.3–1.2)
Total Protein: 6.6 g/dL (ref 6.5–8.1)

## 2021-08-08 LAB — WET PREP, GENITAL
Clue Cells Wet Prep HPF POC: NONE SEEN
Sperm: NONE SEEN
Trich, Wet Prep: NONE SEEN
WBC, Wet Prep HPF POC: >=10 — AB (ref <10)
Yeast Wet Prep HPF POC: NONE SEEN

## 2021-08-08 LAB — POCT PREGNANCY, URINE: Preg Test, Ur: POSITIVE — AB

## 2021-08-08 LAB — HIV ANTIBODY (ROUTINE TESTING W REFLEX): HIV Screen 4th Generation wRfx: NONREACTIVE

## 2021-08-08 LAB — HCG, QUANTITATIVE, PREGNANCY: hCG, Beta Chain, Quant, S: 2257 m[IU]/mL — ABNORMAL HIGH (ref <5)

## 2021-08-08 NOTE — MAU Note (Signed)
4 days ago had bad stomach pains and then started having brown d/c. No longer having pain, d/c has continued. +HPT on 12/26, has not been confirmed.

## 2021-08-08 NOTE — MAU Note (Cosign Needed Addendum)
History    Chief Complaint  Patient presents with   Vaginal Bleeding   Possible Pregnancy   HPI: Kelly Hooper is a G1P0000 @ [redacted]w[redacted]d  here in MAU with complaints of brown vaginal discharge x 4 days, along with two days of abdominal cramping. She reports the abdominal cramping is diffuse throughout her entire abdomen, but intermittent, and resolved on its own this morning. She has not taken any medication for the pain.  She reports a positive home pregnancy test on 07/30/21, and has scheduled an initial OB appointment at [facility] for later this month. Her LMP was at the end of November which is certain.    Past Medical History:  Diagnosis Date   Asthma     Past Surgical History:  Procedure Laterality Date   WISDOM TOOTH EXTRACTION      Family History  Problem Relation Age of Onset   Sickle cell anemia Mother    Asthma Father    Hypertension Maternal Grandmother    Stroke Maternal Grandmother    Hypertension Maternal Grandfather    Heart disease Maternal Grandfather    Diabetes Paternal Grandfather    Breast cancer Paternal Aunt    Breast cancer Paternal Uncle     Social History   Tobacco Use   Smoking status: Never   Smokeless tobacco: Never  Vaping Use   Vaping Use: Never used  Substance Use Topics   Alcohol use: Yes    Alcohol/week: 1.0 standard drink    Types: 1 Glasses of wine per week   Drug use: Never    Allergies:  Allergies  Allergen Reactions   Apple Swelling    Throat swells   Iodine    Shellfish Allergy Anaphylaxis    Throat swells    Medications Prior to Admission  Medication Sig Dispense Refill Last Dose   albuterol (VENTOLIN HFA) 108 (90 Base) MCG/ACT inhaler Inhale into the lungs every 6 (six) hours as needed for wheezing or shortness of breath.      ibuprofen (ADVIL) 600 MG tablet Take 1 tablet (600 mg total) by mouth every 6 (six) hours as needed. 30 tablet 0     Recent Results (from the past 2160 hour(s))  Pregnancy, urine POC      Status: Abnormal   Collection Time: 08/08/21 10:03 AM  Result Value Ref Range   Preg Test, Ur POSITIVE (A) NEGATIVE    Comment:        THE SENSITIVITY OF THIS METHODOLOGY IS >24 mIU/mL   Cervicovaginal ancillary only     Status: None   Collection Time: 08/08/21 10:39 AM  Result Value Ref Range   Neisseria Gonorrhea Negative    Chlamydia Negative    Trichomonas Negative    Bacterial Vaginitis (gardnerella) Negative    Comment      Normal Reference Range Bacterial Vaginosis - Negative   Comment Normal Reference Range Trichomonas - Negative    Comment Normal Reference [place] Chlamydia - Negative    Comment      Normal Reference Range Neisseria Gonorrhea - Negative  CBC with Differential     Status: Abnormal   Collection Time: 08/08/21 10:51 AM  Result Value Ref Range   WBC 8.5 4.0 - 10.5 K/uL   RBC 3.69 (L) 3.87 - 5.11 MIL/uL   Hemoglobin 10.8 (L) 12.0 - 15.0 g/dL   HCT 57.3 (L) 22.0 - 25.4 %   MCV 84.3 80.0 - 100.0 fL   MCH 29.3 26.0 - 34.0 pg  MCHC 34.7 30.0 - 36.0 g/dL   RDW 13.6 11.5 - 15.5 %   Platelets 303 150 - 400 K/uL   nRBC 0.0 0.0 - 0.2 %   Neutrophils Relative % 69 %   Neutro Abs 5.8 1.7 - 7.7 K/uL   Lymphocytes Relative 21 %   Lymphs Abs 1.8 0.7 - 4.0 K/uL   Monocytes Relative 9 %   Monocytes Absolute 0.8 0.1 - 1.0 K/uL   Eosinophils Relative 1 %   Eosinophils Absolute 0.1 0.0 - 0.5 K/uL   Basophils Relative 0 %   Basophils Absolute 0.0 0.0 - 0.1 K/uL   Immature Granulocytes 0 %   Abs Immature Granulocytes 0.03 0.00 - 0.07 K/uL    Comment: Performed at [facility] [address] [place], [place] [address]  Comprehensive metabolic panel Once     Status: Abnormal   Collection Time: 08/08/21 10:51 AM  Result Value Ref Range   Sodium 136 135 - 145 mmol/L   Potassium 3.9 3.5 - 5.1 mmol/L   Chloride 106 98 - 111 mmol/L   CO2 24 22 - 32 mmol/L   Glucose, Bld 107 (H) 70 - 99 mg/dL    Comment: Glucose reference range applies only to samples taken  after fasting for at least 8 hours.   BUN 5 (L) 6 - 20 mg/dL   Creatinine, Ser 0.65 0.44 - 1.00 mg/dL   Calcium 8.9 8.9 - 10.3 mg/dL   Total Protein 6.6 6.5 - 8.1 g/dL   Albumin 3.8 3.5 - 5.0 g/dL   AST 22 15 - 41 U/L   ALT 19 0 - 44 U/L   Alkaline Phosphatase 45 38 - 126 U/L   Total Bilirubin 0.6 0.3 - 1.2 mg/dL   GFR, Estimated >60 >60 mL/min    Comment: (NOTE) Calculated using the CKD-EPI Creatinine Equation (2021)    Anion gap 6 5 - 15    Comment: Performed at [facility] [address], [place], [place] [address]  Type and screen [place]     Status: None   Collection Time: 08/08/21 10:51 AM  Result Value Ref Range   ABO/RH(D) A POS    Antibody Screen NEG    Sample Expiration      08/11/2021,2359 Performed at [facility], [place] [address], [place], [place] [address]   hCG, quantitative, pregnancy     Status: Abnormal   Collection Time: 08/08/21 10:51 AM  Result Value Ref Range   hCG, Beta Chain, Quant, S 2,257 (H) <5 mIU/mL    Comment:          GEST. AGE      CONC.  (mIU/mL)   <=1 WEEK        5 - 50     2 WEEKS       50 - 500     3 WEEKS       100 - 10,000     4 WEEKS     1,000 - 30,000     5 WEEKS     3,500 - 115,000   6-8 WEEKS     12,000 - 270,000    12 WEEKS     15,000 - 220,000        FEMALE AND NON-PREGNANT FEMALE:     LESS THAN 5 mIU/mL Performed at [facility], [place] [address], [place], [place] [address]   HIV Antibody (routine testing w rflx)  Status: None   Collection Time: 08/08/21 10:51 AM  Result Value Ref Range   HIV Screen 4th Generation wRfx Non Reactive Non Reactive    Comment: Performed at [facility], [place] [address], [place], [place] [address]  Urinalysis, Routine w reflex microscopic Urine, Clean Catch     Status: Abnormal   Collection Time: 08/08/21 12:10 PM  Result Value Ref Range   Color, Urine YELLOW YELLOW   APPearance CLEAR CLEAR   Specific Gravity, Urine 1.010 1.005 - 1.030   pH 7.0  5.0 - 8.0   Glucose, UA NEGATIVE NEGATIVE mg/dL   Hgb urine dipstick MODERATE (A) NEGATIVE   Bilirubin Urine NEGATIVE NEGATIVE   Ketones, ur NEGATIVE NEGATIVE mg/dL   Protein, ur NEGATIVE NEGATIVE mg/dL   Nitrite NEGATIVE NEGATIVE   Leukocytes,Ua NEGATIVE NEGATIVE    Comment: Performed at [facility] [address] [place], [place] [address]  Wet prep, genital     Status: Abnormal   Collection Time: 08/08/21 12:10 PM   Specimen: Vaginal  Result Value Ref Range   Yeast Wet Prep HPF POC NONE SEEN NONE SEEN   Trich, Wet Prep NONE SEEN NONE SEEN   Clue Cells Wet Prep HPF POC NONE SEEN NONE SEEN   WBC, Wet Prep HPF POC >=10 (A) <10   Sperm NONE SEEN     Comment: Performed at [facility], [place] [address], [place], [place] [address]  Urinalysis, Microscopic (reflex)     Status: Abnormal   Collection Time: 08/08/21 12:10 PM  Result Value Ref Range   RBC / HPF 6-10 0 - 5 RBC/hpf   WBC, UA 6-10 0 - 5 WBC/hpf   Bacteria, UA FEW (A) NONE SEEN   Squamous Epithelial / LPF 0-5 0 - 5   Mucus PRESENT     Comment: Performed at [facility], [place] [address], [place], [place] [address]  hCG, quantitative, pregnancy     Status: Abnormal   Collection Time: 08/10/21  3:55 PM  Result Value Ref Range   hCG, Beta Chain, Quant, S 1,750 (H) <5 mIU/mL    Comment:          GEST. AGE      CONC.  (mIU/mL)   <=1 WEEK        5 - 50     2 WEEKS       50 - 500     3 WEEKS       100 - 10,000     4 WEEKS     1,000 - 30,000     5 WEEKS     3,500 - 115,000   6-8 WEEKS     12,000 - 270,000    12 WEEKS     15,000 - 220,000        FEMALE AND NON-PREGNANT FEMALE:     LESS THAN 5 mIU/mL Performed at [facility], [place] [address] [place], [place] [address]      US OB LESS THAN 14 WEEKS WITH OB TRANSVAGINAL  Result Date: 08/08/2021 CLINICAL DATA:  Pelvic pain EXAM: OBSTETRIC <14 WK [place] AND TRANSVAGINAL OB US DOPPLER ULTRASOUND OF OVARIES TECHNIQUE: Both transabdominal and transvaginal  ultrasound examinations were performed for complete evaluation of the gestation as well as the maternal uterus, adnexal regions, and pelvic cul-de-sac. Transvaginal technique was performed to assess early pregnancy. Color and duplex Doppler ultrasound was utilized to evaluate blood flow to the ovaries. COMPARISON:  None. FINDINGS: Intrauterine gestational sac: None Yolk sac:  Not seen Embryo:  Not seen Cardiac Activity: Not seen Subchorionic hemorrhage:  None visualized. Maternal uterus/adnexae: Uterus is retroverted. There is 1.3 cm hyperechoic focus in the margin of the left ovary which may be an artifact or small dermoid or hemorrhagic follicle. There is 2 cm complex structure in the right ovary, possibly hemorrhagic cyst/follicle. Small amount of free fluid is seen in the pelvis. Pulsed Doppler evaluation of both ovaries demonstrates normal appearing low-resistance arterial and venous waveforms. IMPRESSION: There is no demonstrable intrauterine pregnancy. If pregnancy test is positive, differential diagnostic possibilities would include very early normal IUP or failed gestation with complete abortion or ectopic gestation. Serial HCG estimations and short-term follow-up sonogram should be considered. Small amount of free fluid in the pelvis may suggest recent rupture of ovarian cyst or follicle. Other findings as described in the body of the report. Electronically Signed   By: Elmer Picker M.D.   On: 08/08/2021 12:14    Review of Systems  Constitutional:  Negative for fever.  Gastrointestinal:  Positive for abdominal pain.  Genitourinary:  Positive for vaginal discharge. Negative for vaginal bleeding.  Physical Exam Blood pressure 129/71, pulse 86, temperature 98.8 F (37.1 C), temperature source Oral, resp. rate 18, height 5\' 2"  (1.575 m), weight 76.6 kg, last menstrual period 07/03/2021, SpO2 100 %. Physical Exam Constitutional:      Appearance: Normal appearance.  HENT:     Head:  Normocephalic.  Abdominal:     Palpations: Abdomen is soft.     Tenderness: There is no abdominal tenderness.  Musculoskeletal:        General: Normal range of motion.  Neurological:     Mental Status: She is alert and oriented to person, place, and time.  Psychiatric:        Behavior: Behavior normal.    MAU Course Procedures  1. Pregnancy with fetus of unknown gestational age   40. Abdominal cramping affecting pregnancy   3. [redacted] weeks gestation of pregnancy   4. Pregnancy of unknown anatomic location     Discharge home Follw up on 1/6 for repeat Quant Return to MAU if symptoms worsen Ectopic precautions  Lilyonna Steidle, Artist Pais, NP 08/13/2021 11:28 AM

## 2021-08-08 NOTE — Telephone Encounter (Signed)
Patient called into the office requesting a call back due to brown discharge & bad cramps at 5 weeks pregnancy.  Called patient back who reports this is her 1st pregnancy and started to have brown discharge and bad cramps for the past 4 days. Patient reports the pain is improving but the brown discharge is continuing. Patient denies recent intercourse. She reports LMP 11/29. Patient states she was going to do a walk in pregnancy test at the office and was hoping to get some answers then. Advised she go to MAU for further evaluation due to pain/bleeding. Patient verbalized understanding.

## 2021-08-08 NOTE — MAU Note (Incomplete Revision)
History    Chief Complaint  Patient presents with   Vaginal Bleeding   Possible Pregnancy   HPI: Kelly Hooper is a 26 y/o G1P0 who presents to care after 4 days of brown, vaginal discharge and two days of abdominal cramping. She notes that the abdominal cramping was diffuse throughout her entire abdomen, but intermittent, and resolved on its own this morning. She adds that her vaginal discharge has continued. Has had one prior BV infection, but denies any other yeast infections or STIs. Her last Pap was benign in June.  She had a positive home pregnancy test on 07/30/21, and has scheduled an initial OB appointment at [facility] for later this month. Her LMP was at the end of November.   OB/GYN Hx: No relevant hx  Past Medical History:  Diagnosis Date   Asthma     Past Surgical History:  Procedure Laterality Date   WISDOM TOOTH EXTRACTION      Family History  Problem Relation Age of Onset   Sickle cell anemia Mother    Asthma Father    Hypertension Maternal Grandmother    Stroke Maternal Grandmother    Hypertension Maternal Grandfather    Heart disease Maternal Grandfather    Diabetes Paternal Grandfather    Breast cancer Paternal Aunt    Breast cancer Paternal Uncle     Social History   Tobacco Use   Smoking status: Never   Smokeless tobacco: Never  Vaping Use   Vaping Use: Never used  Substance Use Topics   Alcohol use: Yes    Alcohol/week: 1.0 standard drink    Types: 1 Glasses of wine per week   Drug use: Never    Allergies:  Allergies  Allergen Reactions   Apple Swelling    Throat swells   Iodine    Shellfish Allergy Anaphylaxis    Throat swells    Medications Prior to Admission  Medication Sig Dispense Refill Last Dose   albuterol (VENTOLIN HFA) 108 (90 Base) MCG/ACT inhaler Inhale into the lungs every 6 (six) hours as needed for wheezing or shortness of breath.      ibuprofen (ADVIL) 600 MG tablet Take 1 tablet (600 mg total) by mouth every 6  (six) hours as needed. 30 tablet 0     Review of Systems Physical Exam Blood pressure 129/71, pulse 86, temperature 98.8 F (37.1 C), temperature source Oral, resp. rate 18, height 5\' 2"  (1.575 m), weight 76.6 kg, last menstrual period 07/03/2021, SpO2 100 %. Physical Exam  MAU Course Procedures  MDM

## 2021-08-09 LAB — CERVICOVAGINAL ANCILLARY ONLY
Bacterial Vaginitis (gardnerella): NEGATIVE
Chlamydia: NEGATIVE
Comment: NEGATIVE
Comment: NEGATIVE
Comment: NEGATIVE
Comment: NORMAL
Neisseria Gonorrhea: NEGATIVE
Trichomonas: NEGATIVE

## 2021-08-10 ENCOUNTER — Other Ambulatory Visit (HOSPITAL_COMMUNITY)
Admit: 2021-08-10 | Discharge: 2021-08-10 | Disposition: A | Discharge status: Home / Self Care | Payer: BC Managed Care – PPO | Attending: Obstetrics and Gynecology | Admitting: Obstetrics and Gynecology

## 2021-08-10 ENCOUNTER — Other Ambulatory Visit: Payer: Self-pay

## 2021-08-10 ENCOUNTER — Inpatient Hospital Stay (HOSPITAL_COMMUNITY)
Admission: AD | Admit: 2021-08-10 | Discharge: 2021-08-10 | Disposition: A | Discharge status: Home / Self Care | Payer: BC Managed Care – PPO | Source: Ambulatory Visit | Attending: Obstetrics and Gynecology | Admitting: Obstetrics and Gynecology

## 2021-08-10 DIAGNOSIS — Z87891 Personal history of nicotine dependence: Secondary | ICD-10-CM | POA: Insufficient documentation

## 2021-08-10 DIAGNOSIS — N939 Abnormal uterine and vaginal bleeding, unspecified: Secondary | ICD-10-CM | POA: Insufficient documentation

## 2021-08-10 DIAGNOSIS — O209 Hemorrhage in early pregnancy, unspecified: Secondary | ICD-10-CM

## 2021-08-10 DIAGNOSIS — R1030 Lower abdominal pain, unspecified: Secondary | ICD-10-CM | POA: Diagnosis present

## 2021-08-10 DIAGNOSIS — Z711 Person with feared health complaint in whom no diagnosis is made: Secondary | ICD-10-CM | POA: Diagnosis not present

## 2021-08-10 LAB — HCG, QUANTITATIVE, PREGNANCY: hCG, Beta Chain, Quant, S: 1750 m[IU]/mL — ABNORMAL HIGH (ref <5)

## 2021-08-10 NOTE — MAU Provider Note (Signed)
History    Chief Complaint  Patient presents with   Follow-up   Kelly Hooper is a 26 yo F G1P0 female presenting for a repeat beta-hCG level.  She was seen on 1/4 for vaginal bleeding and lower abdominal cramping.  Beta-hCG 2,257 with no evidence of IUP on U/S (no gestational or yolk sac).  She is still having intermittent lower abdominal cramping and vaginal bleeding.  Requires one pad daily, similar to her regular period.  Has not seen any products of conception.  Symptoms have been unchanged since 1/4.  Denies any lightheadedness/dizziness, SOB, severe abdominal pain.  Past Medical History:  Diagnosis Date   Asthma    Headache    Sickle cell trait (HCC)    UTI (urinary tract infection)     Past Surgical History:  Procedure Laterality Date   WISDOM TOOTH EXTRACTION      Family History  Problem Relation Age of Onset   Sickle cell anemia Mother    Asthma Father    Breast cancer Paternal Aunt    Breast cancer Paternal Uncle    Hypertension Maternal Grandmother    Stroke Maternal Grandmother    Hypertension Maternal Grandfather    Heart disease Maternal Grandfather    Diabetes Paternal Grandfather     Social History   Tobacco Use   Smoking status: Never   Smokeless tobacco: Never  Vaping Use   Vaping Use: Former  Substance Use Topics   Alcohol use: Not Currently    Alcohol/week: 1.0 standard drink    Types: 1 Glasses of wine per week   Drug use: Never    Allergies:  Allergies  Allergen Reactions   Apple Swelling    Throat swells   Iodine    Shellfish Allergy Anaphylaxis    Throat swells    Medications Prior to Admission  Medication Sig Dispense Refill Last Dose   albuterol (VENTOLIN HFA) 108 (90 Base) MCG/ACT inhaler Inhale into the lungs every 6 (six) hours as needed for wheezing or shortness of breath.      Prenatal Vit-Fe Fumarate-FA (PRENATAL VITAMINS PO) Take by mouth.       Review of Systems  Constitutional:  Negative for fatigue and fever.   Respiratory:  Negative for shortness of breath.   Cardiovascular:  Negative for chest pain.  Gastrointestinal:  Positive for abdominal pain. Negative for nausea and vomiting.  Genitourinary:  Positive for vaginal bleeding. Negative for dysuria.  Skin:  Negative for color change.  Neurological:  Negative for dizziness and light-headedness.  Psychiatric/Behavioral:  Negative for behavioral problems.    Physical Exam Blood pressure 130/67, pulse 88, temperature 98.5 F (36.9 C), temperature source Oral, resp. rate 16, height 5\' 2"  (1.575 m), weight 73.6 kg, last menstrual period 07/03/2021. Physical Exam Constitutional:      General: She is not in acute distress.    Appearance: Normal appearance. She is not ill-appearing.  HENT:     Mouth/Throat:     Mouth: Mucous membranes are moist.  Eyes:     Extraocular Movements: Extraocular movements intact.  Pulmonary:     Effort: Pulmonary effort is normal.  Abdominal:     General: There is no distension.     Palpations: Abdomen is soft. There is no mass.     Tenderness: There is no abdominal tenderness. There is no guarding or rebound.  Skin:    General: Skin is warm.  Neurological:     Mental Status: She is alert and oriented to person, place, and  time.  Psychiatric:        Behavior: Behavior normal.    Assessment and plan:  Hemodynamically stable with benign abdomen.  Beta- HCG downtrending, 1,750 from 2,257 48 hours prior.  Suspicious for spontaneous miscarriage in the setting of abdominal cramping/vaginal bleeding without evidence of pregnancy on [place].   This is a desired pregnancy. Will obtain an additional B-Hcg to confirm downtrend. [facility] 8:30am on 08/13/2021.   MAU return precautions strictly discussed, see AVS.   Allayne Stack, DO

## 2021-08-10 NOTE — MAU Note (Signed)
Here for follow up, repeat blood work. Still cramping and bleeding. Not soaking pads.

## 2021-08-10 NOTE — Discharge Instructions (Signed)
Please make sure you make it to the lab appointment on Monday.   Return to MAU: If you have heavier bleeding that soaks through more than 2 pads per hour for an hour or more/feeling lightheaded/dizzy  If you bleed so much that you feel like you might pass out or you do pass out If you have significant abdominal pain that is not improved with Tylenol 1000 mg every 6 hours as needed for pain If you develop a fever > 100.4

## 2021-08-13 ENCOUNTER — Ambulatory Visit (INDEPENDENT_AMBULATORY_CARE_PROVIDER_SITE_OTHER): Payer: BC Managed Care – PPO | Admitting: *Deleted

## 2021-08-13 ENCOUNTER — Other Ambulatory Visit: Payer: Self-pay

## 2021-08-13 VITALS — BP 126/76 | HR 75 | Ht 62.0 in | Wt 165.9 lb

## 2021-08-13 DIAGNOSIS — Z3490 Encounter for supervision of normal pregnancy, unspecified, unspecified trimester: Secondary | ICD-10-CM

## 2021-08-13 DIAGNOSIS — O3680X0 Pregnancy with inconclusive fetal viability, not applicable or unspecified: Secondary | ICD-10-CM

## 2021-08-13 LAB — BETA HCG QUANT (REF LAB): hCG Quant: 1129 m[IU]/mL

## 2021-08-13 NOTE — Progress Notes (Signed)
Attestation of Attending Supervision of clinical support staff: I agree with the care provided to this patient and was available for any consultation.  I have reviewed the RN's note and chart. I was available for consult and to see the patient if needed.   Cheryal Salas MD MPH Attending Physician Faculty Practice- [facility]  

## 2021-08-13 NOTE — Progress Notes (Signed)
Here for stat bhcg. States bleeding continued until yesterday and since yesterday is spotting. Denies pain. Explained we will draw stat bhcg and have her leave office. She will be called with results in about 2 hours after results received and reviewed by provider. Also informed her about [facility] services if needed . She voices understanding.   Jamya Starry,RN

## 2021-08-13 NOTE — Progress Notes (Signed)
Stat bhcg received of 1129. Reviewed with Dr. Ernestina Patches and discussed this confirms miscarriage. Recommends weekly non stat bhcg until returns to less than 5, also to offer sab fu with provider in 2-3 weeks. I called Lorelei and informed her of results and recommendations. She agreed to lab only appointment for 08/20/21. She declines sab fu appointment. Jaze Rodino,RN

## 2021-08-20 ENCOUNTER — Other Ambulatory Visit: Payer: Self-pay

## 2021-08-20 ENCOUNTER — Other Ambulatory Visit: Payer: Self-pay | Admitting: *Deleted

## 2021-08-20 ENCOUNTER — Other Ambulatory Visit: Payer: BC Managed Care – PPO

## 2021-08-20 DIAGNOSIS — O039 Complete or unspecified spontaneous abortion without complication: Secondary | ICD-10-CM

## 2021-08-21 ENCOUNTER — Telehealth: Payer: Self-pay

## 2021-08-21 DIAGNOSIS — O3680X0 Pregnancy with inconclusive fetal viability, not applicable or unspecified: Secondary | ICD-10-CM

## 2021-08-21 LAB — BETA HCG QUANT (REF LAB): hCG Quant: 944 m[IU]/mL

## 2021-08-21 NOTE — Progress Notes (Signed)
OK to be any provider and APP can consult MD if needed.

## 2021-08-21 NOTE — Telephone Encounter (Addendum)
-----   Message from Federico Flake, MD sent at 08/21/2021 10:12 AM EST ----- BHCG is decreasing but is not rapidly decreasing as expected. This is concerning for ectopic since IUP was never confirmed or retained POC (this is more likely). Recommend patient have repeat lab this week/early next week with provider visit to discuss results and treatment in person.    Called pt; results and provider recommendation reviewed. Lab visit scheduled for 08/27/21 for non stat beta HCG. Provider visit with Vergie Living, MD on 08/28/21 for follow up.

## 2021-08-27 ENCOUNTER — Other Ambulatory Visit: Payer: Self-pay

## 2021-08-27 ENCOUNTER — Other Ambulatory Visit: Payer: BC Managed Care – PPO

## 2021-08-27 DIAGNOSIS — O3680X0 Pregnancy with inconclusive fetal viability, not applicable or unspecified: Secondary | ICD-10-CM

## 2021-08-28 ENCOUNTER — Encounter: Payer: Self-pay | Admitting: Obstetrics and Gynecology

## 2021-08-28 ENCOUNTER — Ambulatory Visit: Payer: BC Managed Care – PPO | Admitting: Obstetrics and Gynecology

## 2021-08-28 VITALS — BP 121/65 | HR 79 | Ht 62.0 in | Wt 167.7 lb

## 2021-08-28 DIAGNOSIS — N83202 Unspecified ovarian cyst, left side: Secondary | ICD-10-CM

## 2021-08-28 DIAGNOSIS — N83201 Unspecified ovarian cyst, right side: Secondary | ICD-10-CM

## 2021-08-28 DIAGNOSIS — Z6831 Body mass index (BMI) 31.0-31.9, adult: Secondary | ICD-10-CM

## 2021-08-28 DIAGNOSIS — O3680X0 Pregnancy with inconclusive fetal viability, not applicable or unspecified: Secondary | ICD-10-CM | POA: Diagnosis not present

## 2021-08-28 LAB — BETA HCG QUANT (REF LAB): hCG Quant: 108 m[IU]/mL

## 2021-08-28 NOTE — Progress Notes (Signed)
Pt here today after SAB. Stopped bleeding 2 days ago. Denies pain at this time. Pt wanting to know how to go forward with future pregnancy planning.  Kelly Hooper

## 2021-08-29 DIAGNOSIS — N83201 Unspecified ovarian cyst, right side: Secondary | ICD-10-CM

## 2021-08-29 DIAGNOSIS — Z6831 Body mass index (BMI) 31.0-31.9, adult: Secondary | ICD-10-CM | POA: Insufficient documentation

## 2021-08-29 DIAGNOSIS — N83202 Unspecified ovarian cyst, left side: Secondary | ICD-10-CM | POA: Insufficient documentation

## 2021-08-29 HISTORY — DX: Unspecified ovarian cyst, right side: N83.201

## 2021-08-29 NOTE — Progress Notes (Signed)
Obstetrics and Gynecology Visit Return Patient Evaluation  Appointment Date: 08/28/2021  Primary Care Provider: Patient, No Pcp Per (Inactive)  OBGYN Clinic: [facility] Complaint: follow up pregnancy of unknown loation  History of Present Illness:  Kelly Hooper is a 26 y.o. G1P0010 with above CC. Patient had VB in early pregnancy early January and followed with serial betas. No VB, period, pain, fever or chills. 1/4 u/s negative except for b/l ovarian cysts (see below). A POS 1/4: 2257 1/6: 1750 1/9: 1129 1/16: 944 1/23: 108  Review of Systems: as noted in the History of Present Illness.  Patient Active Problem List   Diagnosis Date Noted   Family history of breast cancer 01/11/2021   Sickle cell trait (HCC) 08/11/2020   Medications:  Cristy Friedlander had no medications administered during this visit. Current Outpatient Medications  Medication Sig Dispense Refill   albuterol (VENTOLIN HFA) 108 (90 Base) MCG/ACT inhaler Inhale into the lungs every 6 (six) hours as needed for wheezing or shortness of breath.     Prenatal Vit-Fe Fumarate-FA (PRENATAL VITAMINS PO) Take by mouth. (Patient not taking: Reported on 08/28/2021)     No current facility-administered medications for this visit.    Allergies: is allergic to apple, iodine, and shellfish allergy.  Physical Exam:  BP 121/65    Pulse 79    Ht 5\' 2"  (1.575 m)    Wt 167 lb 11.2 oz (76.1 kg)    LMP 07/03/2021 (Exact Date)    Breastfeeding Unknown    BMI 30.67 kg/m  Body mass index is 30.67 kg/m. General appearance: Well nourished, well developed female in no acute distress.  Neuro/Psych:  Normal mood and affect.    Radiology: Narrative & Impression  CLINICAL DATA:  Pelvic pain   EXAM: OBSTETRIC <14 WK 07/05/2021 AND TRANSVAGINAL OB   US DOPPLER ULTRASOUND OF OVARIES   TECHNIQUE: Both transabdominal and transvaginal ultrasound examinations were performed for complete evaluation of the  gestation as well as the maternal uterus, adnexal regions, and pelvic cul-de-sac. Transvaginal technique was performed to assess early pregnancy.   Color and duplex Doppler ultrasound was utilized to evaluate blood flow to the ovaries.   COMPARISON:  None.   FINDINGS: Intrauterine gestational sac: None   Yolk sac:  Not seen   Embryo:  Not seen   Cardiac Activity: Not seen   Subchorionic hemorrhage:  None visualized.   Maternal uterus/adnexae: Uterus is retroverted. There is 1.3 cm hyperechoic focus in the margin of the left ovary which may be an artifact or small dermoid or hemorrhagic follicle. There is 2 cm complex structure in the right ovary, possibly hemorrhagic cyst/follicle. Small amount of free fluid is seen in the pelvis.   Pulsed Doppler evaluation of both ovaries demonstrates normal appearing low-resistance arterial and venous waveforms.   IMPRESSION: There is no demonstrable intrauterine pregnancy. If pregnancy test is positive, differential diagnostic possibilities would include very early normal IUP or failed gestation with complete abortion or ectopic gestation. Serial HCG estimations and short-term follow-up sonogram should be considered.   Small amount of free fluid in the pelvis may suggest recent rupture of ovarian cyst or follicle.   Other findings as described in the body of the report.     Electronically Signed   By: [place] M.D.   On: 08/08/2021 12:14   Assessment: pt doing well  Plan:  1. Cysts of both ovaries Images reviewed and likely benign but will repeat u/s in 2  months - US PELVIS TRANSVAGINAL NON-OB (TV ONLY); Future  2. Pregnancy of unknown anatomic location D/w her likely SAB but have to treat like ectopic. Plan to repeat non stat beta in 10-14 days. Pt desires to try again and I d/w her that I recommend waiting until she has a period next. I also recommend she start on folic acid - Beta hCG quant (ref lab);  Future   RTC: 10 days for lab only visit   Cornelia Copa MD Attending [facility] for [facility])

## 2021-09-06 ENCOUNTER — Other Ambulatory Visit: Payer: Self-pay

## 2021-09-06 NOTE — Progress Notes (Signed)
Opened in error

## 2021-09-07 ENCOUNTER — Other Ambulatory Visit: Payer: Self-pay

## 2021-09-07 ENCOUNTER — Other Ambulatory Visit: Payer: BC Managed Care – PPO

## 2021-09-07 DIAGNOSIS — O3680X0 Pregnancy with inconclusive fetal viability, not applicable or unspecified: Secondary | ICD-10-CM

## 2021-09-08 LAB — BETA HCG QUANT (REF LAB): hCG Quant: 3 m[IU]/mL

## 2021-10-23 ENCOUNTER — Other Ambulatory Visit: Payer: Self-pay

## 2021-10-23 ENCOUNTER — Ambulatory Visit
Admission: RE | Admit: 2021-10-23 | Discharge: 2021-10-23 | Disposition: A | Discharge status: Home / Self Care | Payer: BC Managed Care – PPO | Source: Ambulatory Visit | Attending: Obstetrics and Gynecology | Admitting: Obstetrics and Gynecology

## 2021-10-23 DIAGNOSIS — N83202 Unspecified ovarian cyst, left side: Secondary | ICD-10-CM | POA: Insufficient documentation

## 2021-10-23 DIAGNOSIS — N83201 Unspecified ovarian cyst, right side: Secondary | ICD-10-CM | POA: Insufficient documentation

## 2021-10-25 ENCOUNTER — Encounter: Payer: Self-pay | Admitting: Obstetrics and Gynecology

## 2021-11-28 ENCOUNTER — Encounter: Payer: BC Managed Care – PPO | Admitting: Nurse Practitioner

## 2022-01-08 ENCOUNTER — Encounter: Payer: Self-pay | Admitting: Obstetrics and Gynecology

## 2022-01-22 ENCOUNTER — Ambulatory Visit (INDEPENDENT_AMBULATORY_CARE_PROVIDER_SITE_OTHER): Payer: BC Managed Care – PPO

## 2022-01-22 DIAGNOSIS — Z3201 Encounter for pregnancy test, result positive: Secondary | ICD-10-CM

## 2022-01-22 LAB — POCT PREGNANCY, URINE: Preg Test, Ur: POSITIVE — AB

## 2022-01-22 NOTE — Progress Notes (Signed)
Patient dropped off urine today for a pregnancy test. Urine pregnancy test positive. I called patient to review these results with her. Per patient she had her first positive pregnancy test at home on 01/04/22. Patient denies any abdominal pain or vaginal bleeding. Per patient her LMP was 11/03/21 making her [redacted]w[redacted]d today with an EDD of 08/10/22. Patient states she receives her OB/GYN care here at the [facility] and requests to receive prenatal care here for this pregnancy. New OB appointment scheduled. I recommended patient begin taking prenatal vitamins. Safe medication list provided to patient via mychart. Patient verbalized understanding and denies any other questions.   Alesia Richards, RN 01/22/22

## 2022-01-22 NOTE — Patient Instructions (Signed)

## 2022-01-24 ENCOUNTER — Telehealth (INDEPENDENT_AMBULATORY_CARE_PROVIDER_SITE_OTHER): Payer: BC Managed Care – PPO

## 2022-01-24 DIAGNOSIS — Z3480 Encounter for supervision of other normal pregnancy, unspecified trimester: Secondary | ICD-10-CM

## 2022-01-24 DIAGNOSIS — Z3A00 Weeks of gestation of pregnancy not specified: Secondary | ICD-10-CM

## 2022-01-24 IMAGING — US US OB < 14 WEEKS - US OB TV
1 series · 14 of 28 positions shown · non-contrast
Comparison: None.
COMPARISON: None.

Addendum:
CLINICAL DATA: Pelvic pain



[Series 1: us ob < 14 weeks - us ob tv · 14 of 56 slices shown]
[im 3/56]
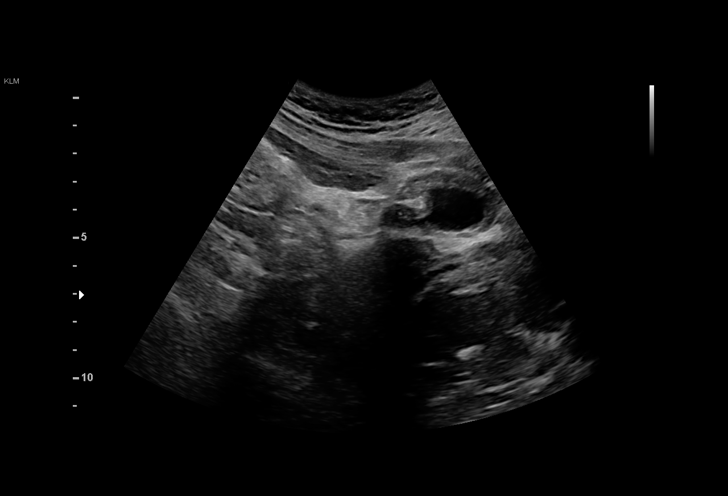
[im 7/56]
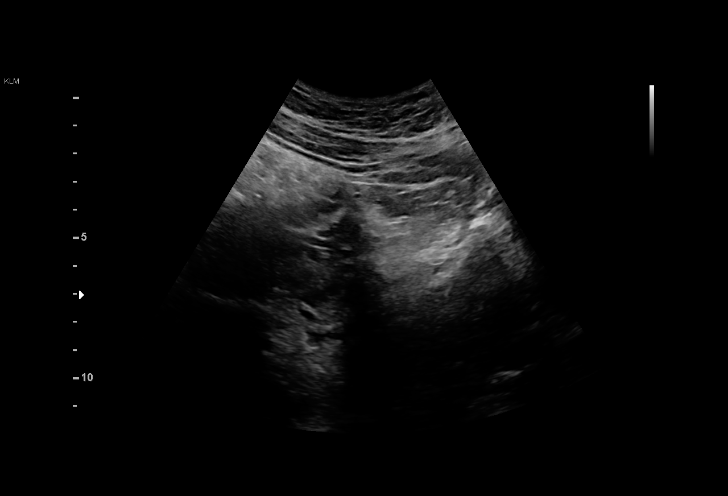
[im 11/56]
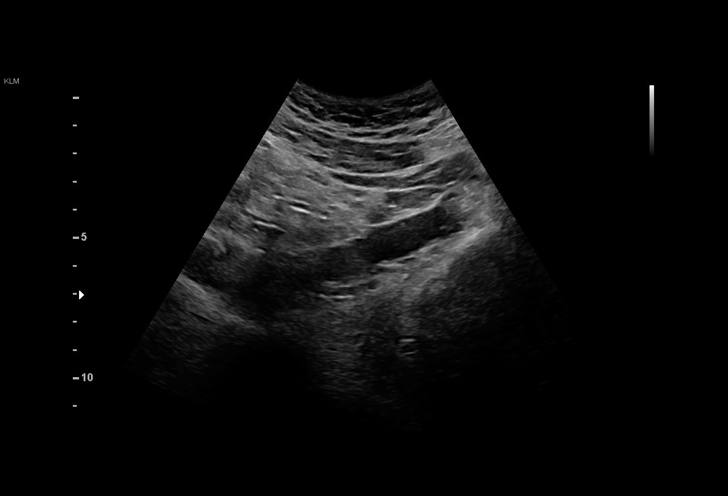
[im 15/56]
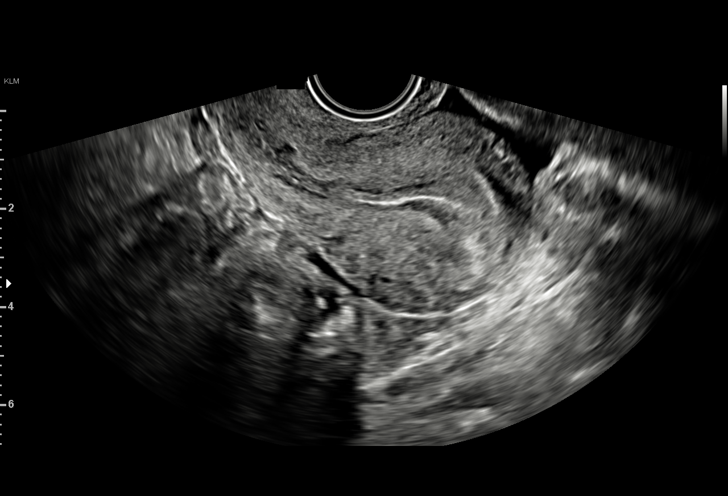
[im 19/56]
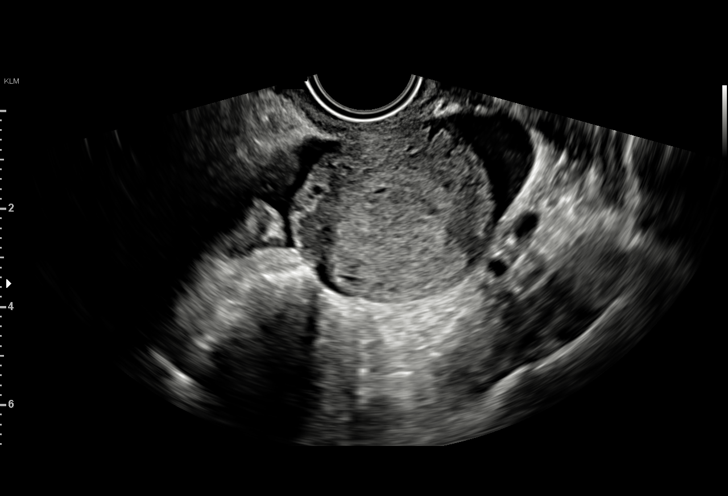
[im 23/56]
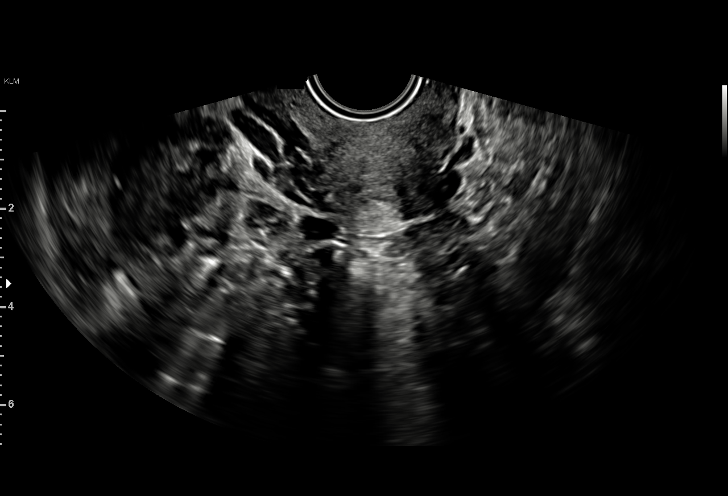
[im 27/56]
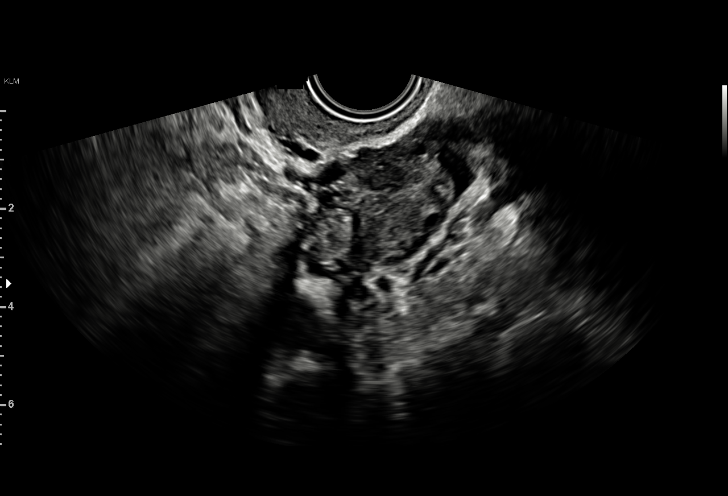
[im 31/56]
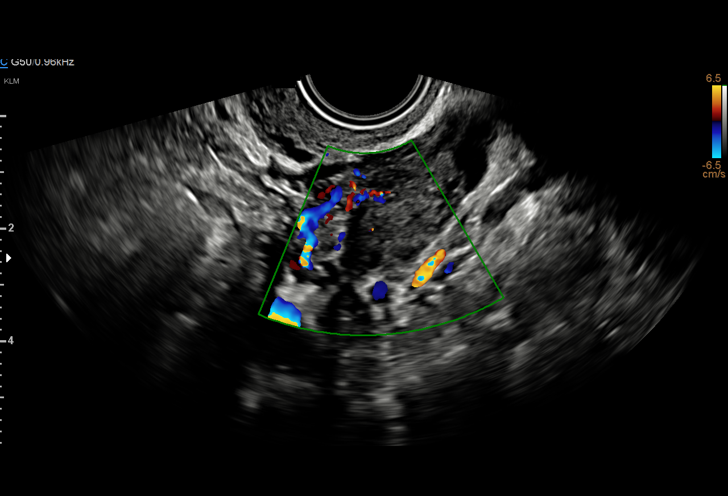
[im 35/56]
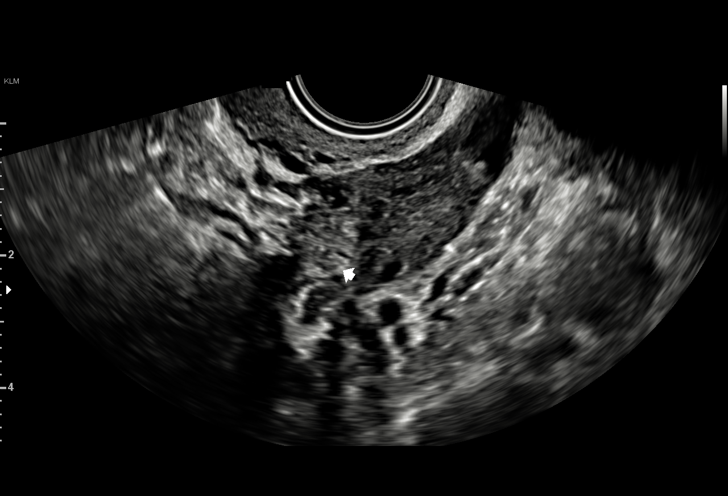
[im 39/56]
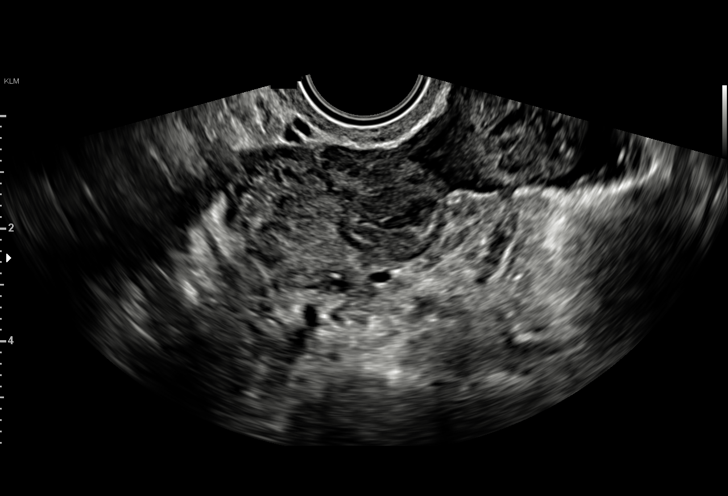
[im 43/56]
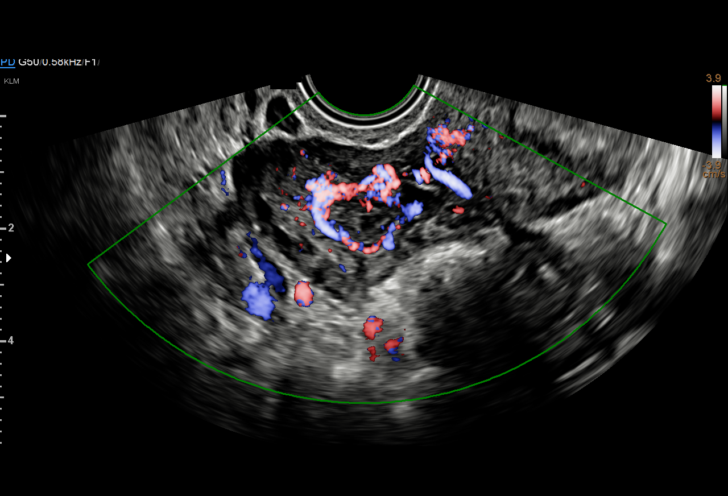
[im 47/56]
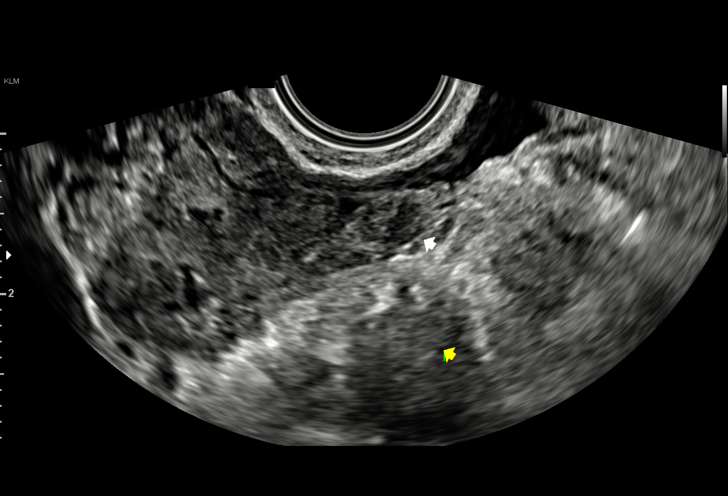
[im 51/56]
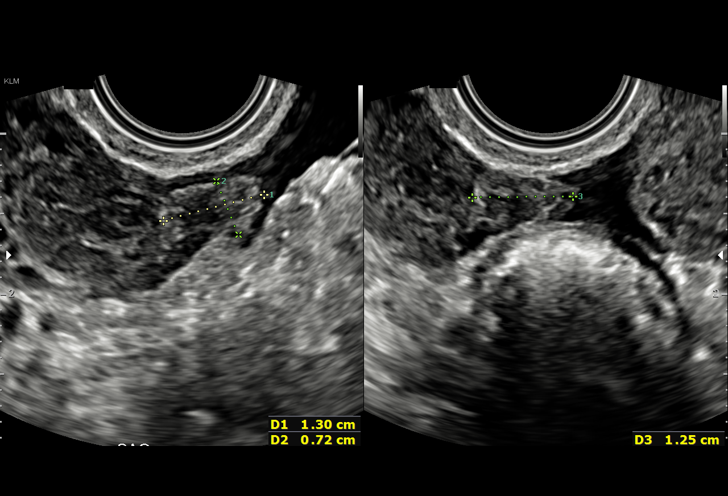
[im 56/56]
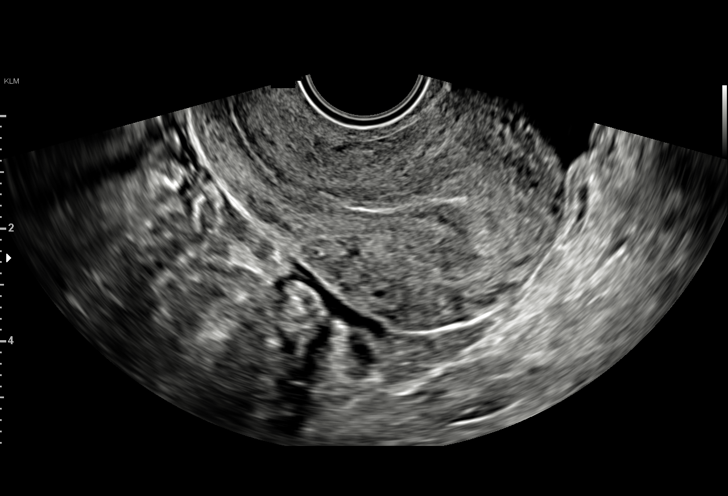

[14 of 28 positions shown; findings below may reference images not displayed]

FINDINGS: Intrauterine gestational sac: None

Yolk sac:  Not seen

Embryo:  Not seen

Cardiac Activity: Not seen

Subchorionic hemorrhage:  None visualized.

Maternal uterus/adnexae: Uterus is retroverted. There is 1.3 cm
hyperechoic focus in the margin of the left ovary which may be an
artifact or small dermoid or hemorrhagic follicle. There is 2 cm
complex structure in the right ovary, possibly hemorrhagic
cyst/follicle. Small amount of free fluid is seen in the pelvis.

Pulsed Doppler evaluation of both ovaries demonstrates normal
appearing low-resistance arterial and venous waveforms.
IMPRESSION: There is no demonstrable intrauterine pregnancy. If pregnancy test
is positive, differential diagnostic possibilities would include
very early normal IUP or failed gestation with complete abortion or
ectopic gestation. Serial HCG estimations and short-term follow-up
sonogram should be considered.

Small amount of free fluid in the pelvis may suggest recent rupture
of ovarian cyst or follicle.

Other findings as described in the body of the report.

ADDENDUM:
This addendum is made to clarify Doppler technique for the study.
Color flow Doppler examination was performed. Pulsed Doppler
examination was not performed.

*** End of Addendum ***
FINDINGS: Intrauterine gestational sac: None

Yolk sac:  Not seen

Embryo:  Not seen

Cardiac Activity: Not seen

Subchorionic hemorrhage:  None visualized.

Maternal uterus/adnexae: Uterus is retroverted. There is 1.3 cm
hyperechoic focus in the margin of the left ovary which may be an
artifact or small dermoid or hemorrhagic follicle. There is 2 cm
complex structure in the right ovary, possibly hemorrhagic
cyst/follicle. Small amount of free fluid is seen in the pelvis.

Pulsed Doppler evaluation of both ovaries demonstrates normal
appearing low-resistance arterial and venous waveforms.
IMPRESSION: There is no demonstrable intrauterine pregnancy. If pregnancy test
is positive, differential diagnostic possibilities would include
very early normal IUP or failed gestation with complete abortion or
ectopic gestation. Serial HCG estimations and short-term follow-up
sonogram should be considered.

Small amount of free fluid in the pelvis may suggest recent rupture
of ovarian cyst or follicle.

Other findings as described in the body of the report.

## 2022-01-24 NOTE — Progress Notes (Signed)
New OB Intake  I connected with  Cristy Friedlander on 01/24/22 at  1:15 PM EDT by MyChart Video Visit and verified that I am speaking with the correct person using two identifiers. Nurse is located at [facility] and pt is located at [facility].  I discussed the limitations, risks, security and privacy concerns of performing an evaluation and management service by telephone and the availability of in person appointments. I also discussed with the patient that there may be a patient responsible charge related to this service. The patient expressed understanding and agreed to proceed.  I explained I am completing New OB Intake today. We discussed her EDD of 09/07/22 that is based on LMP of 12/01/21. Pt is G2/P0. I reviewed her allergies, medications, Medical/Surgical/OB history, and appropriate screenings. I informed her of [facility] services. Based on history, this is a/an  pregnancy uncomplicated .   Patient Active Problem List   Diagnosis Date Noted   BMI 31.0-31.9,adult 08/29/2021   Family history of breast cancer 01/11/2021   Sickle cell trait (HCC) 08/11/2020    Concerns addressed today  Delivery Plans:  Plans to deliver at [facility].   MyChart/Babyscripts MyChart access verified. I explained pt will have some visits in office and some virtually. Babyscripts instructions given and order placed. Patient verifies receipt of registration text/e-mail. Account successfully created and app downloaded.  Blood Pressure Cuff  Patient has private insurance; instructed to purchase blood pressure cuff and bring to first prenatal appt. Explained after first prenatal appt pt will check weekly and document in Babyscripts.  Weight scale: Patient does / does not  have weight scale. Weight scale ordered for patient to pick up from [employer].   Anatomy US Explained first scheduled [place] will be around 19 weeks. Anatomy US scheduled for 04/15/22 at 0745. Pt notified to arrive at 0730. Scheduled AFP lab only  appointment if CenteringPregnancy pt for same day as anatomy US.   Labs Discussed Avelina Laine genetic screening with patient. Would like both Panorama and Horizon drawn at new OB visit.Also if interested in genetic testing, tell patient she will need AFP 15-21 weeks to complete genetic testing .Routine prenatal labs needed.  Covid Vaccine Patient has not covid vaccine.   Is patient a CenteringPregnancy candidate?  Not a Candidate Declined due to    Not a candidate due to  DUE 09/07/22 Centering Patient" indicated on sticky note   Is patient a Mom+Baby Combined Care candidate?  Pt not sure/wants to talk to Husband     Scheduled with Mom+Baby provider    Is patient interested in [place]?  No   "Interested in [employer] - Schedule next visit with CNM" on sticky note  Informed patient of [facility] website  and placed link in her AVS.   Social Determinants of Health Food Insecurity: Patient denies food insecurity. WIC Referral: Patient is interested in referral to [facility].  Transportation: Patient denies transportation needs. Childcare: Discussed no children allowed at ultrasound appointments. Offered childcare services; patient declines childcare services at this time.  Send link to Pregnancy Navigators   Placed OB Box on problem list and updated  First visit review I reviewed new OB appt with pt. I explained she will have a pelvic exam, ob bloodwork with genetic screening, and PAP smear. Explained pt will be seen by Dr. Crissie Reese at first visit; encounter routed to appropriate provider. Explained that patient will be seen by pregnancy navigator following visit with provider. [facility] information placed in AVS.  Henrietta Dine, CMA 01/24/2022  1:27 PM

## 2022-01-24 NOTE — Patient Instructions (Signed)
Safe Medications in Pregnancy   Acne: Benzoyl Peroxide Salicylic Acid  Backache/Headache: Tylenol: 2 regular strength every 4 hours OR              2 Extra strength every 6 hours  Colds/Coughs/Allergies: Benadryl (alcohol free) 25 mg every 6 hours as needed Breath right strips Claritin Cepacol throat lozenges Chloraseptic throat spray Cold-Eeze- up to three times per day Cough drops, alcohol free Flonase (by prescription only) Guaifenesin Mucinex Robitussin DM (plain only, alcohol free) Saline nasal spray/drops Sudafed (pseudoephedrine) & Actifed ** use only after [redacted] weeks gestation and if you do not have high blood pressure Tylenol Vicks Vaporub Zinc lozenges Zyrtec   Constipation: Colace Ducolax suppositories Fleet enema Glycerin suppositories Metamucil Milk of magnesia Miralax Senokot Smooth move tea  Diarrhea: Kaopectate Imodium A-D  *NO pepto Bismol  Hemorrhoids: Anusol Anusol HC Preparation H Tucks  Indigestion: Tums Maalox Mylanta Zantac  Pepcid  Insomnia: Benadryl (alcohol free) 25mg every 6 hours as needed Tylenol PM Unisom, no Gelcaps  Leg Cramps: Tums MagGel  Nausea/Vomiting:  Bonine Dramamine Emetrol Ginger extract Sea bands Meclizine  Nausea medication to take during pregnancy:  Unisom (doxylamine succinate 25 mg tablets) Take one tablet daily at bedtime. If symptoms are not adequately controlled, the dose can be increased to a maximum recommended dose of two tablets daily (1/2 tablet in the morning, 1/2 tablet mid-afternoon and one at bedtime). Vitamin B6 100mg tablets. Take one tablet twice a day (up to 200 mg per day).  Skin Rashes: Aveeno products Benadryl cream or 25mg every 6 hours as needed Calamine Lotion 1% cortisone cream  Yeast infection: Gyne-lotrimin 7 Monistat 7   **If taking multiple medications, please check labels to avoid duplicating the same active ingredients **take medication as directed on  the label ** Do not exceed 4000 mg of tylenol in 24 hours **Do not take medications that contain aspirin or ibuprofen      AREA PEDIATRIC/FAMILY PRACTICE PHYSICIANS  [place] ([address]) [facility] Chambliss, MD; Eniola, MD; Hale, MD; Hensel, MD; McDiarmid, MD; McIntyer, MD; Amariyon Maynes, MD; Walden, MD [address] [place], [place] [address] [phone] Mon-Fri 8:30-12:30, 1:30-5:00 Providers come to see babies at [facility] Accepting Medicaid [facility] providers who accept newborns: Koirala, MD; Morrow, MD; Wolters, MD [address], [place], [place] [address] [phone] Mon-Fri 8:00-5:30 Babies seen by providers at [facility] Does NOT accept Medicaid Please call early in hospitalization for appointment (limited availability)  [facility] Mulberry, MD [address] [place], [place] [address] [phone] Mon, Tue, Thur, Fri 8:30-5:00, Wed 10:00-7:00 (closed 1-2pm) Babies seen by [facility] providers Accepting Medicaid [facility] - Pediatrician Rubin, MD [address], [place], [place] [address] [phone] Mon-Fri 8:30-5:00, Sat 8:30-12:00 Provider comes to see babies at [facility] Accepting Medicaid Must have been referred from current patients or contacted office prior to delivery [facility] ([facility]) Brown, MD; Chandler, MD; Ettefagh, MD; Grant, MD; Lester, MD; McCormick, MD; McQueen, MD; Prose, MD; Simha, MD; Stanley, MD; Stryffeler, NP; Tebben, NP [address], [place], [place] [address] [phone] Mon, Tue, Thur, Fri 8:30-5:30, Wed 9:30-5:30, Sat 8:30-12:30 Babies seen by [facility] providers Accepting Medicaid Only accepting infants of first-time parents or siblings of current patients Hospital discharge coordinator will make  follow-up appointment Jack Amos [address], , [place]  [address] [phone]     Fax - [phone] [facility] [address], [place], [place]  [address] Phone - [phone]   Fax - [phone] Shilpa Gosrani [address], [place], [place]  [address] [phone]  [place] ([address]) [facility] Bates, MD; Brassfield, MD; Cooper, Cox, MD; MD; Davis, MD; Dovico, MD; Ettefaugh, MD; Little, MD; Lowe, MD; Keiffer, MD; Melvin, MD; Sumner, MD; Williams, MD [address], [place], [place] [address] [phone] Mon-Fri 8:30-5:00 (extended evenings Mon-Thur as needed), Sat-Sun 10:00-1:00 Providers come to see babies at [facility] Accepting Medicaid for families of first-time babies and families with all children in the household age 3 and under. Must register with office prior to making appointment (M-F only). [facility] Henson, NP; Knapp, MD; Lalonde, MD; Tysinger, PA [address] [place], [place] [address] [phone] Mon-Fri 8:00-5:00 Babies seen by providers at [facility] Does NOT accept Medicaid/Commercial Insurance Only [facility] ([facility])  Artis, MD; Barnes, MD; Bratton, MD; Coccaro, MD; Lockett Gardner, MD; Kramer, MD; Marshall, MD; Netherton, MD; Poleto, MD; Skinner, MD [address] [place], [place] [address] [phone] Mon-Fri 8:30-5:30, Sat (Oct.-Mar.) 9:00-1:00 Babies seen by providers at [facility] Accepting Medicaid  [place] ([address]) [facility] Reid, MD; Warner, MD [address], [place], [place] [address] [phone] Mon-Fri 8:30-5:00, Sat 8:30-12:00 Providers come to see babies at [facility] Does NOT accept Medicaid [facility] Becker, PA; Hagler, MD; Scifres, PA; Sun, MD; Swayne, MD [address], [place], [place]  [address] [phone] Mon-Fri 8:00-5:00 Babies seen by providers at [facility] Does NOT accept Medicaid Only accepting babies of parents who are patients Please call early in hospitalization for appointment (limited availability) [facility] Clark, MD; Frye, MD; Kelleher, MD; Mack, NP; Miller, MD; O'Keller, MD; Patterson, NP; Pudlo, MD; Puzio, MD; Thomas, MD; Tucker, MD; Twiselton, MD [address], [place], [place] [address] [phone] Mon-Fri 8:00-5:00, Sat 9:00-12:00 Providers come to see babies at [facility] Does NOT accept Medicaid  [place] ([address]) [facility] providers accepting new patients: Brake, NP; Wharton, PA [address], [place], [place] [address] [phone] Mon-Fri 8:00-5:00 Babies seen by providers at [facility] Does NOT accept Medicaid Only accepting babies of parents who are patients Please call early in hospitalization for appointment (limited availability) [facility] Gay, MD; Quinlan, MD [address] [place], [place] [address] [phone] (press 1 to schedule appointment) Mon-Fri 8:00-5:00 Providers come to see babies at [facility] Does NOT accept Medicaid [facility] Mazer, MD [address] [place], [place] [address] [phone] Mon-Fri 8:30-5:00 (lunch 12:30-1:00), extended hours by appointment only Wed 5:00-6:30 Babies seen by [facility] providers Accepting Medicaid [facility] Banks, MD; Jordan, MD; Koberlein, MD [address], [place], [place] [address] [phone] Mon-Fri 8:00-5:00 Babies seen by [facility] providers Does NOT accept Medicaid [facility] Parker, MD; Hunter, MD; Wallace, DO [address] , [place] [address] [phone] Mon-Fri 8:00-5:00 Babies seen by [facility] providers Does NOT accept Medicaid [facility] Brandon, PA; Brecken, PA; Christy, NP; Dees, MD; DeClaire, MD; DeWeese, MD; Hansen, NP; Mills, NP; Parrish, NP; Smoot, NP; Summer, MD; Vapne, MD [address] ,  [address] [phone] Mon-Fri 8:30-5:00, Sat 10:00-1:00 Providers come to see babies at [facility] Does NOT accept Medicaid Free prenatal information session Tuesdays at 4:45pm [facility] Bouska, MD; Gordon, PA; Jeffery, PA; Weber, PA [address] [place] [address] [phone] Mon-Fri 7:30-5:30 Babies seen by [facility] providers [facility] [address], [place], [place]  [address] [phone]   Fax - [phone]  [place] ([address] & [address]) [facility] [place] [address] [place], [place] [address] [phone] Mon-Thur 8:00-6:00 Providers come to see babies at [facility] Accepting Medicaid [facility] Anderson, NP; Badger, MD; Beal, PA; Spencer, PA [address] [place], [place] [address] [phone] Mon-Thur 7:30-7:30, Fri 7:30-4:30 Babies seen by [facility] providers Accepting Medicaid [facility] Agbuya, MD; Klett, NP; Romgoolam, MD [address], [place], [place] [address] [phone] Mon-Fri 8:30-5:00, Sat 8:30-12:00 Providers come to see babies at [facility] Accepting Medicaid Must have "Meet & Greet" appointment at office prior to delivery [facility] McCord, MD; Wallace, MD; Wood, MD [address], [place], [place] [address] [phone] Mon-Wed 8:00-6:00, Thur-Fri 8:00-5:00, Sat 9:00-12:00 Providers come to see babies at [facility] Does NOT accept Medicaid Only accepting siblings of current patients [facility]  [address], [place], [place]  [address] [phone]   Fax  - [phone] [facility] [address], [place], [place]  [address] [phone]   Fax - [phone]  [place] ([address] & [address]) [facility] Cirigliano, DO; Matthews, DO [address] , [place] [address] [phone] Mon-Fri 7:00-5:00 Babies seen by [facility] providers Does NOT accept Medicaid [facility] Briscoe, MD; Howley, PA; Moreira, PA [address], [place], [place] [address] [phone] Mon-Fri 8:00-5:00 Babies seen by [facility] providers Accepting Medicaid [facility] Boyd, MD; Church, PA; Jones, NP; Osborn, PA [address], , [place] [address] [phone] Mon-Fri 8:00-5:00 Babies seen by providers at [facility] Accepting Medicaid  [place] ([address]) [facility] [place] Wendling, DO [address] [place], [place] [address] [phone] Mon-Fri 8:00-5:00 Babies seen by [facility] providers Does NOT accept Medicaid Limited availability, please call early in hospitalization to schedule follow-up [facility] Calderon, PA; Cummings, MD; Dillard, MD; Martin, PA; Olson, MD; VanDeven, PA [address], [place], [place] [address] [phone] Mon-Fri 8:30-5:00, Sat 9:00-12:00 Babies seen by providers at [facility] Accepting Medicaid Please register online then schedule online or call office [email or web address] Hunter, NP; Kumar, MD; Martin Rogers, PA [address], [place], [place] [address] [phone] Mon-Fri 8:00-5:00 Babies seen by providers at [facility] Accepting Medicaid [facility] ([facility]) Longview, MD; Kristi Fleenor, NP; West, MD [address], [place], [place] [address] [phone] Mon-Fri 8:00-5:30, Sat&Sun by appointment (phones open at 8:30) Babies seen by [facility] providers Accepting Medicaid Must be a first-time baby or sibling of current patient [facility] [place]  [address], [place], [place]  [address] [phone]   Fax - [phone]  [place] ([address]) [facility] Brown, PA; Cowen, PA; Rice, MD; Helton, PA; Spry, MD [address] [place],  [address] [phone] Mon-Thur 8:00-7:00, Fri 8:00-5:00, Sat 8:00-12:00, Sun 9:00-12:00 Babies seen by [facility] providers Accepting Medicaid [facility] Coe-Goins, MD; Marshall, MD; Pierre-Louis, MD [address], [place], [place] [address] [phone] Mon-Thur 8:00-5:00 Babies seen by providers at [facility] Accepting Medicaid [facility] Bratton, MD; Coe-Goins, MD; Hayes, MD; Lewis, MD; List, MD; Lott, MD; Marshall, MD; Moran, MD; O'Aleeah Greeno, MD; Pierre-Louis, MD; Pitonzo, MD; Scholer, MD; Spangle, MD [address] [place], [place] [address] [phone] Mon-Fri 8:00-5:30, Sat (Oct.-Mar.) 9:00-1:00 Babies seen by providers at [facility] Accepting Medicaid Must fill out new patient packet, available online at [email or web address] [facility] ([facility]) Friddle, NP; Harris, NP; Kelly, NP; Logan, MD; Melvin, PA; Poth, MD; Ramadoss, MD; Stanton, NP [address], [place], [place] [address] [phone] Mon-Thur 8:00-5:30, Fri 8:00-5:00 Babies seen by providers at [facility] Accepting Medicaid  [place] ([address]) [facility] Dixon, PA; Ellinwood, MD; Pickard, MD; Tapia, PA [address], [place], [place] [address] [phone] Mon-Fri  8:00-5:00 Babies seen by providers at [facility] Accepting Medicaid   [place] ([address]) [facility] [place] Masneri, DO; Meyers, MD; Nelson, PA [address], [place], [place] [address] [phone] Mon-Fri 8:00-5:00 Babies seen by providers at [facility] Does NOT accept Medicaid Limited appointment availability, please call early in hospitalization  [facility] [place] Kunedd, DO; McGowen, MD [address], [place], [place] [address] [phone] Mon-Fri 8:00-5:00 Babies seen by [facility] providers Does NOT accept Medicaid [facility] [place] Cameron, MD; MacDonald, MD; Michaels, PA; Nayak, MD [address] [place] [address], [place], [place] [address] [phone] Mon-Fri 8:00-5:00 After hours clinic ([address] [place], [place] [address]) [phone] Mon-Fri 5:00-8:00, Sat 12:00-6:00, Sun 10:00-4:00 Babies seen by [facility] providers Accepting Medicaid [facility] [place] [address], [place], [place]  [address] [phone]   Fax - [phone]  [place] ([address]) [facility] [place] [facility] Andy, MD [address], [place], [place] [address] [phone] Mon-Fri 8:00-5:00 Babies seen by [facility] providers Does NOT accept Medicaid [facility] [place] ([facility] [place]) Eksir, MD [address], [place], [place] [address] [phone] Mon-Thur 8:00-7:00, Fri 8:00-5:00, Sat 8:00-12:00 Babies seen by providers at [facility] Accepting Medicaid - but does not have vaccinations in office (must be received elsewhere) Limited availability, please call early in hospitalization  [place] ([address]) [facility]  Charlene Flemming, MD [address], [place] [address] [phone]  Fax [phone]  [facility]   Kimberly Newton, MD, Annamarie Streilein, PA, Carla Hampton, PA [address] [place], [place] [address] [phone] [facility]  [address], [place], [place] [address] [phone] [address], [place], [place] [address] [phone] (West Office)  [facility] [address], [place], [place] [address] [phone] [facility] [address], [place], [place] [address] [phone] [facility] [address], [place], [place] [address] [phone] [facility] [address], [place], [place] [address] [phone] [facility] [address], [place], [place] [address] [phone] [facility] [address], [place], [place] [address] [phone] [facility]  [address], [place], [place] [address] [phone] Dr[facility] [address], [place],  [address] [phone] [facility] [address], [place], [place] [address] [phone] [facility] [address], [place], [place] [address] [phone]  

## 2022-03-05 ENCOUNTER — Encounter: Payer: Self-pay | Admitting: Family Medicine

## 2022-03-05 ENCOUNTER — Other Ambulatory Visit: Payer: Self-pay

## 2022-03-05 ENCOUNTER — Ambulatory Visit (INDEPENDENT_AMBULATORY_CARE_PROVIDER_SITE_OTHER): Payer: BC Managed Care – PPO | Admitting: Family Medicine

## 2022-03-05 DIAGNOSIS — Z3480 Encounter for supervision of other normal pregnancy, unspecified trimester: Secondary | ICD-10-CM

## 2022-03-05 NOTE — Patient Instructions (Signed)
Second Trimester of Pregnancy  The second trimester of pregnancy is from week 13 through week 27. This is months 4 through 6 of pregnancy. The second trimester is often a time when you feel your best. Your body has adjusted to being pregnant, and you begin to feel better physically. During the second trimester: Morning sickness has lessened or stopped completely. You may have more energy. You may have an increase in appetite. The second trimester is also a time when the unborn baby (fetus) is growing rapidly. At the end of the sixth month, the fetus may be up to 12 inches long and weigh about 1 pounds. You will likely begin to feel the baby move (quickening) between 16 and 20 weeks of pregnancy. Body changes during your second trimester Your body continues to go through many changes during your second trimester. The changes vary and generally return to normal after the baby is born. Physical changes Your weight will continue to increase. You will notice your lower abdomen bulging out. You may begin to get stretch marks on your hips, abdomen, and breasts. Your breasts will continue to grow and to become tender. Dark spots or blotches (chloasma or mask of pregnancy) may develop on your face. A dark line from your belly button to the pubic area (linea nigra) may appear. You may have changes in your hair. These can include thickening of your hair, rapid growth, and changes in texture. Some people also have hair loss during or after pregnancy, or hair that feels dry or thin. Health changes You may develop headaches. You may have heartburn. You may develop constipation. You may develop hemorrhoids or swollen, bulging veins (varicose veins). Your gums may bleed and may be sensitive to brushing and flossing. You may urinate more often because the fetus is pressing on your bladder. You may have back pain. This is caused by: Weight gain. Pregnancy hormones that are relaxing the joints in your  pelvis. A shift in weight and the muscles that support your balance. Follow these instructions at home: Medicines Follow your health care provider's instructions regarding medicine use. Specific medicines may be either safe or unsafe to take during pregnancy. Do not take any medicines unless approved by your health care provider. Take a prenatal vitamin that contains at least 600 micrograms (mcg) of folic acid. Eating and drinking Eat a healthy diet that includes fresh fruits and vegetables, whole grains, good sources of protein such as meat, eggs, or tofu, and low-fat dairy products. Avoid raw meat and unpasteurized juice, milk, and cheese. These carry germs that can harm you and your baby. You may need to take these actions to prevent or treat constipation: Drink enough fluid to keep your urine pale yellow. Eat foods that are high in fiber, such as beans, whole grains, and fresh fruits and vegetables. Limit foods that are high in fat and processed sugars, such as fried or sweet foods. Activity Exercise only as directed by your health care provider. Most people can continue their usual exercise routine during pregnancy. Try to exercise for 30 minutes at least 5 days a week. Stop exercising if you develop contractions in your uterus. Stop exercising if you develop pain or cramping in the lower abdomen or lower back. Avoid exercising if it is very hot or humid or if you are at a high altitude. Avoid heavy lifting. If you choose to, you may have sex unless your health care provider tells you not to. Relieving pain and discomfort Wear a supportive   bra to prevent discomfort from breast tenderness. Take warm sitz baths to soothe any pain or discomfort caused by hemorrhoids. Use hemorrhoid cream if your health care provider approves. Rest with your legs raised (elevated) if you have leg cramps or low back pain. If you develop varicose veins: Wear support hose as told by your health care  provider. Elevate your feet for 15 minutes, 3-4 times a day. Limit salt in your diet. Safety Wear your seat belt at all times when driving or riding in a car. Talk with your health care provider if someone is verbally or physically abusive to you. Lifestyle Do not use hot tubs, steam rooms, or saunas. Do not douche. Do not use tampons or scented sanitary pads. Avoid cat litter boxes and soil used by cats. These carry germs that can cause birth defects in the baby and possibly loss of the fetus by miscarriage or stillbirth. Do not use herbal remedies, alcohol, illegal drugs, or medicines that are not approved by your health care provider. Chemicals in these products can harm your baby. Do not use any products that contain nicotine or tobacco, such as cigarettes, e-cigarettes, and chewing tobacco. If you need help quitting, ask your health care provider. General instructions During a routine prenatal visit, your health care provider will do a physical exam and other tests. He or she will also discuss your overall health. Keep all follow-up visits. This is important. Ask your health care provider for a referral to a local prenatal education class. Ask for help if you have counseling or nutritional needs during pregnancy. Your health care provider can offer advice or refer you to specialists for help with various needs. Where to find more information American Pregnancy Association: americanpregnancy.org American College of Obstetricians and Gynecologists: acog.org/en/Womens%20Health/Pregnancy Office on Women's Health: womenshealth.gov/pregnancy Contact a health care provider if you have: A headache that does not go away when you take medicine. Vision changes or you see spots in front of your eyes. Mild pelvic cramps, pelvic pressure, or nagging pain in the abdominal area. Persistent nausea, vomiting, or diarrhea. A bad-smelling vaginal discharge or foul-smelling urine. Pain when you  urinate. Sudden or extreme swelling of your face, hands, ankles, feet, or legs. A fever. Get help right away if you: Have fluid leaking from your vagina. Have spotting or bleeding from your vagina. Have severe abdominal cramping or pain. Have difficulty breathing. Have chest pain. Have fainting spells. Have not felt your baby move for the time period told by your health care provider. Have new or increased pain, swelling, or redness in an arm or leg. Summary The second trimester of pregnancy is from week 13 through week 27 (months 4 through 6). Do not use herbal remedies, alcohol, illegal drugs, or medicines that are not approved by your health care provider. Chemicals in these products can harm your baby. Exercise only as directed by your health care provider. Most people can continue their usual exercise routine during pregnancy. Keep all follow-up visits. This is important. This information is not intended to replace advice given to you by your health care provider. Make sure you discuss any questions you have with your health care provider. Document Revised: 12/29/2019 Document Reviewed: 11/04/2019 Elsevier Patient Education  2023 Elsevier Inc.  Contraception Choices Contraception, also called birth control, refers to methods or devices that prevent pregnancy. Hormonal methods  Contraceptive implant A contraceptive implant is a thin, plastic tube that contains a hormone that prevents pregnancy. It is different from an intrauterine device (  IUD). It is inserted into the upper part of the arm by a health care provider. Implants can be effective for up to 3 years. Progestin-only injections Progestin-only injections are injections of progestin, a synthetic form of the hormone progesterone. They are given every 3 months by a health care provider. Birth control pills Birth control pills are pills that contain hormones that prevent pregnancy. They must be taken once a day, preferably at  the same time each day. A prescription is needed to use this method of contraception. Birth control patch The birth control patch contains hormones that prevent pregnancy. It is placed on the skin and must be changed once a week for three weeks and removed on the fourth week. A prescription is needed to use this method of contraception. Vaginal ring A vaginal ring contains hormones that prevent pregnancy. It is placed in the vagina for three weeks and removed on the fourth week. After that, the process is repeated with a new ring. A prescription is needed to use this method of contraception. Emergency contraceptive Emergency contraceptives prevent pregnancy after unprotected sex. They come in pill form and can be taken up to 5 days after sex. They work best the sooner they are taken after having sex. Most emergency contraceptives are available without a prescription. This method should not be used as your only form of birth control. Barrier methods  Female condom A female condom is a thin sheath that is worn over the penis during sex. Condoms keep sperm from going inside a woman's body. They can be used with a sperm-killing substance (spermicide) to increase their effectiveness. They should be thrown away after one use. Female condom A female condom is a soft, loose-fitting sheath that is put into the vagina before sex. The condom keeps sperm from going inside a woman's body. They should be thrown away after one use. Diaphragm A diaphragm is a soft, dome-shaped barrier. It is inserted into the vagina before sex, along with a spermicide. The diaphragm blocks sperm from entering the uterus, and the spermicide kills sperm. A diaphragm should be left in the vagina for 6-8 hours after sex and removed within 24 hours. A diaphragm is prescribed and fitted by a health care provider. A diaphragm should be replaced every 1-2 years, after giving birth, after gaining more than 15 lb (6.8 kg), and after pelvic  surgery. Cervical cap A cervical cap is a round, soft latex or plastic cup that fits over the cervix. It is inserted into the vagina before sex, along with spermicide. It blocks sperm from entering the uterus. The cap should be left in place for 6-8 hours after sex and removed within 48 hours. A cervical cap must be prescribed and fitted by a health care provider. It should be replaced every 2 years. Sponge A sponge is a soft, circular piece of polyurethane foam with spermicide in it. The sponge helps block sperm from entering the uterus, and the spermicide kills sperm. To use it, you make it wet and then insert it into the vagina. It should be inserted before sex, left in for at least 6 hours after sex, and removed and thrown away within 30 hours. Spermicides Spermicides are chemicals that kill or block sperm from entering the cervix and uterus. They can come as a cream, jelly, suppository, foam, or tablet. A spermicide should be inserted into the vagina with an applicator at least 10-15 minutes before sex to allow time for it to work. The process must be   repeated every time you have sex. Spermicides do not require a prescription. Intrauterine contraception Intrauterine device (IUD) An IUD is a T-shaped device that is put in a woman's uterus. There are two types: Hormone IUD.This type contains progestin, a synthetic form of the hormone progesterone. This type can stay in place for 3-5 years. Copper IUD.This type is wrapped in copper wire. It can stay in place for 10 years. Permanent methods of contraception Female tubal ligation In this method, a woman's fallopian tubes are sealed, tied, or blocked during surgery to prevent eggs from traveling to the uterus. Hysteroscopic sterilization In this method, a small, flexible insert is placed into each fallopian tube. The inserts cause scar tissue to form in the fallopian tubes and block them, so sperm cannot reach an egg. The procedure takes about 3  months to be effective. Another form of birth control must be used during those 3 months. Female sterilization This is a procedure to tie off the tubes that carry sperm (vasectomy). After the procedure, the man can still ejaculate fluid (semen). Another form of birth control must be used for 3 months after the procedure. Natural planning methods Natural family planning In this method, a couple does not have sex on days when the woman could become pregnant. Calendar method In this method, the woman keeps track of the length of each menstrual cycle, identifies the days when pregnancy can happen, and does not have sex on those days. Ovulation method In this method, a couple avoids sex during ovulation. Symptothermal method This method involves not having sex during ovulation. The woman typically checks for ovulation by watching changes in her temperature and in the consistency of cervical mucus. Post-ovulation method In this method, a couple waits to have sex until after ovulation. Where to find more information Centers for Disease Control and Prevention: www.cdc.gov Summary Contraception, also called birth control, refers to methods or devices that prevent pregnancy. Hormonal methods of contraception include implants, injections, pills, patches, vaginal rings, and emergency contraceptives. Barrier methods of contraception can include female condoms, female condoms, diaphragms, cervical caps, sponges, and spermicides. There are two types of IUDs (intrauterine devices). An IUD can be put in a woman's uterus to prevent pregnancy for 3-5 years. Permanent sterilization can be done through a procedure for males and females. Natural family planning methods involve nothaving sex on days when the woman could become pregnant. This information is not intended to replace advice given to you by your health care provider. Make sure you discuss any questions you have with your health care provider. Document  Revised: 12/27/2019 Document Reviewed: 12/27/2019 Elsevier Patient Education  2023 Elsevier Inc.  

## 2022-03-05 NOTE — Progress Notes (Signed)
Subjective:   Kelly Hooper is a 26 y.o. G2P0010 at [redacted]w[redacted]d by certain LMP being seen today for her first obstetrical visit.  Her obstetrical history is significant for  asthma . Patient does intend to breast feed. Pregnancy history fully reviewed.  Patient reports no complaints.  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/2021   Headache    Sickle cell trait (HCC)    UTI (urinary tract infection)    Past Surgical History:  Procedure Laterality Date   WISDOM TOOTH EXTRACTION     Family History  Problem Relation Age of Onset   Sickle cell anemia Mother    Asthma Father    Breast cancer Paternal Aunt    Breast cancer Paternal Uncle    Hypertension Maternal Grandmother    Stroke Maternal Grandmother    Epilepsy Maternal Grandmother    Hypertension Maternal Grandfather    Heart disease Maternal Grandfather    Diabetes Paternal Grandfather    Social History   Tobacco Use   Smoking status: Never   Smokeless tobacco: Never  Vaping Use   Vaping Use: Former  Substance Use Topics   Alcohol use: Not Currently    Alcohol/week: 1.0 standard drink of alcohol    Types: 1 Glasses of wine per week   Drug use: Never   Allergies  Allergen Reactions   Apple Juice Swelling    Throat swells   Iodine    Shellfish Allergy Anaphylaxis    Throat swells   Current Outpatient Medications on File Prior to Visit  Medication Sig Dispense Refill   albuterol (VENTOLIN HFA) 108 (90 Base) MCG/ACT inhaler Inhale into the lungs every 6 (six) hours as needed for wheezing or shortness  of breath.     Prenatal Vit-Fe Fumarate-FA (PRENATAL VITAMINS PO) Take by mouth.     No current facility-administered medications on file prior to visit.     Exam   Vitals:   03/05/22 1508  BP: 126/79  Pulse: 82  Weight: 164 lb 3.2 oz (74.5 kg)   Fetal Heart Rate (bpm): 160  System: General: well-developed, well-nourished female in no acute distress   Skin: normal coloration and turgor, no rashes   Neurologic: oriented, normal, negative, normal mood   Extremities: normal strength, tone, and muscle mass, ROM of all joints is normal   HEENT PERRLA, extraocular movement intact and sclera clear, anicteric   Neck supple and no masses   Respiratory:  no respiratory distress      Assessment:   Pregnancy: G2P0010 Patient Active Problem List   Diagnosis Date Noted   Supervision of other normal pregnancy, antepartum 01/24/2022   BMI 31.0-31.9,adult 08/29/2021   Family history of breast cancer 01/11/2021   Sickle cell trait (HCC) 08/11/2020     Plan:  1. Supervision of other normal pregnancy, antepartum BP and  FHR normal Initial labs drawn. Continue prenatal vitamins. Genetic Screening discussed, NIPS: ordered. Ultrasound discussed; fetal anatomic survey: ordered. Problem list reviewed and updated. The nature of [facility] clinic was explained to patient; Voiced they may need to be seen by other [facility] providers which includes family medicine physicians, OB GYNs, and APPs. Delivery will hopefully be with one of the [facility] providers or another [facility] Medicine physician and we cannot promise this at this time.  Discussed there are [facility] staff in the hospital 24-7 and they understand and support this model and there is a likelihood one of these providers will catch their baby.  We also discussed that the service includes learners (residents, student) and they will be involved in the care team.  Routine obstetric precautions reviewed. Return in 4 weeks (on 04/02/2022) for [facility] patient,  ob visit.

## 2022-03-06 LAB — CBC/D/PLT+RPR+RH+ABO+RUBIGG...
Antibody Screen: NEGATIVE
Basophils Absolute: 0 10*3/uL (ref 0.0–0.2)
Basos: 0 %
EOS (ABSOLUTE): 0.1 10*3/uL (ref 0.0–0.4)
Eos: 0 %
HCV Ab: NONREACTIVE
HIV Screen 4th Generation wRfx: NONREACTIVE
Hematocrit: 36.2 % (ref 34.0–46.6)
Hemoglobin: 12.1 g/dL (ref 11.1–15.9)
Hepatitis B Surface Ag: NEGATIVE
Immature Grans (Abs): 0.1 10*3/uL (ref 0.0–0.1)
Immature Granulocytes: 1 %
Lymphocytes Absolute: 1.9 10*3/uL (ref 0.7–3.1)
Lymphs: 17 %
MCH: 29.2 pg (ref 26.6–33.0)
MCHC: 33.4 g/dL (ref 31.5–35.7)
MCV: 87 fL (ref 79–97)
Monocytes Absolute: 0.9 10*3/uL (ref 0.1–0.9)
Monocytes: 8 %
Neutrophils Absolute: 8.2 10*3/uL — ABNORMAL HIGH (ref 1.4–7.0)
Neutrophils: 74 %
Platelets: 345 10*3/uL (ref 150–450)
RBC: 4.15 x10E6/uL (ref 3.77–5.28)
RDW: 13.8 % (ref 11.7–15.4)
RPR Ser Ql: NONREACTIVE
Rh Factor: POSITIVE
Rubella Antibodies, IGG: 1.21 index (ref >0.99)
WBC: 11.3 10*3/uL — ABNORMAL HIGH (ref 3.4–10.8)

## 2022-03-06 LAB — HEMOGLOBIN A1C
Est. average glucose Bld gHb Est-mCnc: 100 mg/dL
Hgb A1c MFr Bld: 5.1 % (ref 4.8–5.6)

## 2022-03-06 LAB — HCV INTERPRETATION

## 2022-03-07 LAB — CULTURE, OB URINE

## 2022-03-07 LAB — URINE CULTURE, OB REFLEX

## 2022-03-12 LAB — PANORAMA PRENATAL TEST FULL PANEL:PANORAMA TEST PLUS 5 ADDITIONAL MICRODELETIONS: FETAL FRACTION: 15.5

## 2022-03-14 LAB — HORIZON 4 (SMA, CF, FRAGILE X, DMD)
CYSTIC FIBROSIS: NEGATIVE
DUCHENNE/BECKER MUSCULAR DYSTROPHY: NEGATIVE
FRAGILE X SYNDROME: NEGATIVE
REPORT SUMMARY: NEGATIVE
SPINAL MUSCULAR ATROPHY: NEGATIVE

## 2022-04-03 ENCOUNTER — Encounter: Payer: Self-pay | Admitting: Family Medicine

## 2022-04-03 ENCOUNTER — Ambulatory Visit (INDEPENDENT_AMBULATORY_CARE_PROVIDER_SITE_OTHER): Payer: BC Managed Care – PPO | Admitting: Family Medicine

## 2022-04-03 ENCOUNTER — Other Ambulatory Visit: Payer: Self-pay

## 2022-04-03 VITALS — BP 109/74 | HR 89 | Wt 162.1 lb

## 2022-04-03 DIAGNOSIS — Z3480 Encounter for supervision of other normal pregnancy, unspecified trimester: Secondary | ICD-10-CM

## 2022-04-03 NOTE — Progress Notes (Signed)
   Subjective:  Kelly Hooper is a 26 y.o. G2P0010 at [redacted]w[redacted]d being seen today for ongoing prenatal care.  She is currently monitored for the following issues for this low-risk pregnancy and has Sickle cell trait (HCC); Family history of breast cancer; BMI 31.0-31.9,adult; and Supervision of other normal pregnancy, antepartum on their problem list.  Patient reports no complaints.  Contractions: Not present. Vag. Bleeding: None.  Movement: Present. Denies leaking of fluid.   The following portions of the patient's history were reviewed and updated as appropriate: allergies, current medications, past family history, past medical history, past social history, past surgical history and problem list. Problem list updated.  Objective:   Vitals:   04/03/22 0817  BP: 109/74  Pulse: 89  Weight: 162 lb 1.6 oz (73.5 kg)    Fetal Status: Fetal Heart Rate (bpm): 160   Movement: Present     General:  Alert, oriented and cooperative. Patient is in no acute distress.  Skin: Skin is warm and dry. No rash noted.   Cardiovascular: Normal heart rate noted  Respiratory: Normal respiratory effort, no problems with respiration noted  Abdomen: Soft, gravid, appropriate for gestational age. Pain/Pressure: Absent     Pelvic: Vag. Bleeding: None     Cervical exam deferred        Extremities: Normal range of motion.     Mental Status: Normal mood and affect. Normal behavior. Normal judgment and thought content.   Urinalysis:      Assessment and Plan:  Pregnancy: G2P0010 at [redacted]w[redacted]d  1. Supervision of other normal pregnancy, antepartum BP and FHR normal AFP today Worried about having anatomy scan so late given hx of miscarriage, provided reassurance that risk of miscarriage is significantly lower after first trimester and also actionable information pre-viability is not common, so OK to wait until her currently scheduled [place] Having some dry skin on her breast, possibly due to enlargement during pregnancy, continue  using creams/ointments Having some tooth pain, plans to see a dentist soon, discussed we can provide a letter if needed for this  Preterm labor symptoms and general obstetric precautions including but not limited to vaginal bleeding, contractions, leaking of fluid and fetal movement were reviewed in detail with the patient. Please refer to After Visit Summary for other counseling recommendations.  Return in 4 weeks (on 05/01/2022) for [facility] patient, ob visit.   Venora Maples, MD

## 2022-04-03 NOTE — Patient Instructions (Signed)

## 2022-04-05 LAB — AFP, SERUM, OPEN SPINA BIFIDA
AFP MoM: 2.05
AFP Value: 89 ng/mL
Gest. Age on Collection Date: 17.4 weeks
Maternal Age At EDD: 26.9 yr
OSBR Risk 1 IN: 1443
Test Results:: NEGATIVE
Weight: 162 [lb_av]

## 2022-04-15 ENCOUNTER — Ambulatory Visit: Payer: BC Managed Care – PPO | Admitting: *Deleted

## 2022-04-15 ENCOUNTER — Ambulatory Visit: Payer: BC Managed Care – PPO | Attending: Family Medicine

## 2022-04-15 ENCOUNTER — Encounter: Payer: Self-pay | Admitting: *Deleted

## 2022-04-15 VITALS — BP 110/54 | HR 78

## 2022-04-15 DIAGNOSIS — N83209 Unspecified ovarian cyst, unspecified side: Secondary | ICD-10-CM | POA: Insufficient documentation

## 2022-04-15 DIAGNOSIS — Z3A19 19 weeks gestation of pregnancy: Secondary | ICD-10-CM | POA: Insufficient documentation

## 2022-04-15 DIAGNOSIS — Z862 Personal history of diseases of the blood and blood-forming organs and certain disorders involving the immune mechanism: Secondary | ICD-10-CM | POA: Diagnosis not present

## 2022-04-15 DIAGNOSIS — O358XX0 Maternal care for other (suspected) fetal abnormality and damage, not applicable or unspecified: Secondary | ICD-10-CM | POA: Diagnosis present

## 2022-04-15 DIAGNOSIS — O3482 Maternal care for other abnormalities of pelvic organs, second trimester: Secondary | ICD-10-CM | POA: Insufficient documentation

## 2022-04-15 DIAGNOSIS — O99212 Obesity complicating pregnancy, second trimester: Secondary | ICD-10-CM | POA: Diagnosis not present

## 2022-04-15 DIAGNOSIS — Z3480 Encounter for supervision of other normal pregnancy, unspecified trimester: Secondary | ICD-10-CM | POA: Insufficient documentation

## 2022-04-15 DIAGNOSIS — Z368A Encounter for antenatal screening for other genetic defects: Secondary | ICD-10-CM | POA: Insufficient documentation

## 2022-04-24 ENCOUNTER — Encounter: Payer: Self-pay | Admitting: Family Medicine

## 2022-04-29 ENCOUNTER — Encounter: Payer: Self-pay | Admitting: Family Medicine

## 2022-05-23 ENCOUNTER — Encounter: Payer: Self-pay | Admitting: Family Medicine

## 2022-07-22 ENCOUNTER — Encounter: Payer: Self-pay | Admitting: *Deleted
